# Patient Record
Sex: Male | Born: 1945 | Race: White | Hispanic: No | Marital: Married | Smoking: Former smoker
Health system: Southern US, Community
[De-identification: ages and names within clinical notes are randomized; demographics above are authoritative.]

## PROBLEM LIST (undated history)

## (undated) DIAGNOSIS — N250 Renal osteodystrophy: Secondary | ICD-10-CM

## (undated) DIAGNOSIS — D509 Iron deficiency anemia, unspecified: Secondary | ICD-10-CM

## (undated) DIAGNOSIS — N189 Chronic kidney disease, unspecified: Secondary | ICD-10-CM

## (undated) DIAGNOSIS — E8770 Fluid overload, unspecified: Secondary | ICD-10-CM

## (undated) DIAGNOSIS — I509 Heart failure, unspecified: Secondary | ICD-10-CM

## (undated) DIAGNOSIS — R52 Pain, unspecified: Secondary | ICD-10-CM

## (undated) DIAGNOSIS — D689 Coagulation defect, unspecified: Secondary | ICD-10-CM

## (undated) DIAGNOSIS — N185 Chronic kidney disease, stage 5: Secondary | ICD-10-CM

## (undated) DIAGNOSIS — M1990 Unspecified osteoarthritis, unspecified site: Secondary | ICD-10-CM

## (undated) DIAGNOSIS — E46 Unspecified protein-calorie malnutrition: Secondary | ICD-10-CM

## (undated) DIAGNOSIS — R011 Cardiac murmur, unspecified: Secondary | ICD-10-CM

## (undated) DIAGNOSIS — K219 Gastro-esophageal reflux disease without esophagitis: Secondary | ICD-10-CM

## (undated) DIAGNOSIS — D631 Anemia in chronic kidney disease: Secondary | ICD-10-CM

## (undated) DIAGNOSIS — E119 Type 2 diabetes mellitus without complications: Secondary | ICD-10-CM

## (undated) DIAGNOSIS — R197 Diarrhea, unspecified: Secondary | ICD-10-CM

## (undated) DIAGNOSIS — N2581 Secondary hyperparathyroidism of renal origin: Secondary | ICD-10-CM

## (undated) DIAGNOSIS — L299 Pruritus, unspecified: Secondary | ICD-10-CM

## (undated) DIAGNOSIS — I729 Aneurysm of unspecified site: Secondary | ICD-10-CM

## (undated) DIAGNOSIS — L97509 Non-pressure chronic ulcer of other part of unspecified foot with unspecified severity: Secondary | ICD-10-CM

## (undated) DIAGNOSIS — E11649 Type 2 diabetes mellitus with hypoglycemia without coma: Secondary | ICD-10-CM

## (undated) DIAGNOSIS — I2510 Atherosclerotic heart disease of native coronary artery without angina pectoris: Secondary | ICD-10-CM

## (undated) DIAGNOSIS — R0602 Shortness of breath: Secondary | ICD-10-CM

## (undated) HISTORY — DX: Chronic kidney disease, stage 5: N18.5

## (undated) HISTORY — DX: Shortness of breath: R06.02

## (undated) HISTORY — DX: Fluid overload, unspecified: E87.70

## (undated) HISTORY — DX: Coagulation defect, unspecified: D68.9

## (undated) HISTORY — DX: Type 2 diabetes mellitus with hypoglycemia without coma: E11.649

## (undated) HISTORY — DX: Non-pressure chronic ulcer of other part of unspecified foot with unspecified severity: L97.509

## (undated) HISTORY — DX: Anemia in chronic kidney disease: N18.9

## (undated) HISTORY — DX: Hypercalcemia: E83.52

## (undated) HISTORY — PX: COLONOSCOPY: SHX174

## (undated) HISTORY — DX: Heart failure, unspecified: I50.9

## (undated) HISTORY — PX: FOOT SURGERY: SHX648

## (undated) HISTORY — DX: Iron deficiency anemia, unspecified: D50.9

## (undated) HISTORY — PX: HAMMER TOE SURGERY: SHX385

## (undated) HISTORY — DX: Anemia in chronic kidney disease: D63.1

## (undated) HISTORY — DX: Secondary hyperparathyroidism of renal origin: N25.81

## (undated) HISTORY — DX: Pruritus, unspecified: L29.9

## (undated) HISTORY — DX: Pain, unspecified: R52

## (undated) HISTORY — DX: Diarrhea, unspecified: R19.7

## (undated) HISTORY — DX: Renal osteodystrophy: N25.0

## (undated) HISTORY — DX: Type 2 diabetes mellitus without complications: E11.9

## (undated) HISTORY — DX: Aneurysm of unspecified site: I72.9

## (undated) HISTORY — DX: Unspecified protein-calorie malnutrition: E46

## (undated) HISTORY — PX: OTHER SURGICAL HISTORY: SHX169

---

## 2006-08-26 HISTORY — PX: CORONARY ARTERY BYPASS GRAFT: SHX141

## 2011-07-29 DIAGNOSIS — I10 Essential (primary) hypertension: Secondary | ICD-10-CM | POA: Insufficient documentation

## 2011-07-29 DIAGNOSIS — R609 Edema, unspecified: Secondary | ICD-10-CM | POA: Insufficient documentation

## 2011-07-29 DIAGNOSIS — E785 Hyperlipidemia, unspecified: Secondary | ICD-10-CM | POA: Insufficient documentation

## 2012-02-12 DIAGNOSIS — T148XXA Other injury of unspecified body region, initial encounter: Secondary | ICD-10-CM | POA: Insufficient documentation

## 2012-02-12 DIAGNOSIS — W19XXXA Unspecified fall, initial encounter: Secondary | ICD-10-CM | POA: Insufficient documentation

## 2012-09-18 DIAGNOSIS — H6120 Impacted cerumen, unspecified ear: Secondary | ICD-10-CM | POA: Insufficient documentation

## 2013-04-26 DIAGNOSIS — L0390 Cellulitis, unspecified: Secondary | ICD-10-CM | POA: Insufficient documentation

## 2014-01-23 DIAGNOSIS — S82142A Displaced bicondylar fracture of left tibia, initial encounter for closed fracture: Secondary | ICD-10-CM | POA: Insufficient documentation

## 2014-01-31 DIAGNOSIS — S82892A Other fracture of left lower leg, initial encounter for closed fracture: Secondary | ICD-10-CM | POA: Insufficient documentation

## 2014-04-07 DIAGNOSIS — S82209A Unspecified fracture of shaft of unspecified tibia, initial encounter for closed fracture: Secondary | ICD-10-CM | POA: Insufficient documentation

## 2014-04-15 DIAGNOSIS — I358 Other nonrheumatic aortic valve disorders: Secondary | ICD-10-CM | POA: Insufficient documentation

## 2014-05-14 DIAGNOSIS — E1110 Type 2 diabetes mellitus with ketoacidosis without coma: Secondary | ICD-10-CM | POA: Insufficient documentation

## 2014-06-02 DIAGNOSIS — I5032 Chronic diastolic (congestive) heart failure: Secondary | ICD-10-CM | POA: Insufficient documentation

## 2014-08-26 HISTORY — PX: AORTIC VALVE REPLACEMENT: SHX41

## 2014-12-21 DIAGNOSIS — I2510 Atherosclerotic heart disease of native coronary artery without angina pectoris: Secondary | ICD-10-CM | POA: Insufficient documentation

## 2015-04-28 DIAGNOSIS — L02214 Cutaneous abscess of groin: Secondary | ICD-10-CM | POA: Insufficient documentation

## 2015-08-27 HISTORY — PX: CORONARY ANGIOPLASTY: SHX604

## 2017-07-30 DIAGNOSIS — N2581 Secondary hyperparathyroidism of renal origin: Secondary | ICD-10-CM | POA: Insufficient documentation

## 2017-07-30 DIAGNOSIS — Z992 Dependence on renal dialysis: Secondary | ICD-10-CM | POA: Insufficient documentation

## 2017-07-30 DIAGNOSIS — Z794 Long term (current) use of insulin: Secondary | ICD-10-CM | POA: Insufficient documentation

## 2017-07-30 DIAGNOSIS — N250 Renal osteodystrophy: Secondary | ICD-10-CM | POA: Insufficient documentation

## 2017-07-30 DIAGNOSIS — E46 Unspecified protein-calorie malnutrition: Secondary | ICD-10-CM | POA: Insufficient documentation

## 2017-07-30 DIAGNOSIS — D509 Iron deficiency anemia, unspecified: Secondary | ICD-10-CM | POA: Insufficient documentation

## 2017-08-01 DIAGNOSIS — R52 Pain, unspecified: Secondary | ICD-10-CM | POA: Insufficient documentation

## 2017-08-01 DIAGNOSIS — D689 Coagulation defect, unspecified: Secondary | ICD-10-CM | POA: Insufficient documentation

## 2017-08-01 DIAGNOSIS — R197 Diarrhea, unspecified: Secondary | ICD-10-CM | POA: Insufficient documentation

## 2017-08-01 DIAGNOSIS — L299 Pruritus, unspecified: Secondary | ICD-10-CM | POA: Insufficient documentation

## 2017-10-22 DIAGNOSIS — E8770 Fluid overload, unspecified: Secondary | ICD-10-CM | POA: Insufficient documentation

## 2018-04-01 ENCOUNTER — Other Ambulatory Visit: Payer: Self-pay

## 2018-04-01 DIAGNOSIS — L97509 Non-pressure chronic ulcer of other part of unspecified foot with unspecified severity: Secondary | ICD-10-CM

## 2018-04-06 ENCOUNTER — Encounter: Payer: Self-pay | Admitting: Vascular Surgery

## 2018-04-07 ENCOUNTER — Ambulatory Visit (INDEPENDENT_AMBULATORY_CARE_PROVIDER_SITE_OTHER): Payer: Medicare Other

## 2018-04-07 ENCOUNTER — Ambulatory Visit (INDEPENDENT_AMBULATORY_CARE_PROVIDER_SITE_OTHER): Payer: Medicare Other | Admitting: Podiatry

## 2018-04-07 DIAGNOSIS — I739 Peripheral vascular disease, unspecified: Secondary | ICD-10-CM | POA: Diagnosis not present

## 2018-04-07 DIAGNOSIS — E119 Type 2 diabetes mellitus without complications: Secondary | ICD-10-CM | POA: Diagnosis not present

## 2018-04-07 DIAGNOSIS — E1151 Type 2 diabetes mellitus with diabetic peripheral angiopathy without gangrene: Secondary | ICD-10-CM | POA: Diagnosis not present

## 2018-04-07 DIAGNOSIS — M21611 Bunion of right foot: Secondary | ICD-10-CM

## 2018-04-07 DIAGNOSIS — L97511 Non-pressure chronic ulcer of other part of right foot limited to breakdown of skin: Secondary | ICD-10-CM

## 2018-04-07 DIAGNOSIS — M205X1 Other deformities of toe(s) (acquired), right foot: Secondary | ICD-10-CM

## 2018-04-07 DIAGNOSIS — M2011 Hallux valgus (acquired), right foot: Secondary | ICD-10-CM

## 2018-04-07 NOTE — Progress Notes (Signed)
Subjective:  Patient ID: John Ortiz, male    DOB: 02/22/1946,  MRN: [id number]   72 y.o. male presents for wound care. Reports wound to the bottom of the right foot below the great toe.  Started years ago.  Has had issues ever since.  Reports 7 out of 10 throbbing pain associated with it.  Has used Eucerin cream and Band-Aids.  Review of Systems: Negative except as noted in the HPI. Denies N/V/F/Ch.  Past Medical History:  Diagnosis Date  . Anemia in CKD (chronic kidney disease)   . Aneurysm (HCC)    Left arm  . CHF (congestive heart failure) ([employer])   . Chronic kidney disease, stage V ([place])   . Coagulation defect ([place])   . Diabetes mellitus type 2, uncomplicated ([place])   . Diarrhea   . Fluid overload   . Hypercalcemia   . Iron deficiency anemia, unspecified   . Non-healing ulcer of foot ([place])    Right foot  . Pain   . Pruritus   . Renal osteodystrophy   . Secondary hyperparathyroidism of renal origin ([place])   . Shortness of breath   . Type 2 diabetes mellitus with hypoglycemia without coma ([place])   . Unspecified protein-calorie malnutrition ([place])     Current Outpatient Medications:  .  aspirin EC 81 MG tablet, Take 81 mg by mouth daily., Disp: , Rfl:  .  calcium acetate (PHOSLO) 667 MG capsule, Take 2,001 mg by mouth 3 (three) times daily with meals., Disp: , Rfl:  .  clopidogrel (PLAVIX) 75 MG tablet, Take 75 mg by mouth daily., Disp: , Rfl:  .  Cyanocobalamin (B-12) 500 MCG TABS, Take 500 mcg by mouth 3 (three) times daily. , Disp: , Rfl:  .  FLUoxetine (PROZAC) 20 MG capsule, Take 20 mg by mouth daily., Disp: , Rfl:  .  furosemide (LASIX) 40 MG tablet, Take 40 mg by mouth daily. , Disp: , Rfl:  .  insulin aspart (NOVOLOG FLEXPEN) 100 UNIT/ML FlexPen, Inject 7-15 Units into the skin 3 (three) times daily with meals. Per sliding scale, Disp: , Rfl:  .  insulin glargine (LANTUS) 100 UNIT/ML injection, Inject 4 Units into the skin 2 (two) times daily. , Disp: , Rfl:  .   simvastatin (ZOCOR) 80 MG tablet, Take 80 mg by mouth daily., Disp: , Rfl:   Social History   Tobacco Use  Smoking Status Former Smoker  Smokeless Tobacco Never Used    No Known Allergies Objective:  There were no vitals filed for this visit. There is no height or weight on file to calculate BMI. Constitutional Well developed. Well nourished.  Vascular Dorsalis pedis pulses 1+ bilaterally. Posterior tibial pulses non-palpable bilaterally. Capillary refill normal to all digits.  No cyanosis or clubbing noted. Pedal hair growth absent.  Neurologic Normal speech. Oriented to person, place, and time. Protective sensation absent  Dermatologic Wound Location: R 1st MPJ Wound Base: Granular/Healthy Peri-wound: Macerated, Calloused Exudate: Moderate amount Serosanguinous exudate Wound Measurements: -8/13: 4.5x3 post-debridement   Orthopedic: No pain to palpation either foot.   Radiographs: Taken and reviewed.  No underlying fracture dislocation.  No underlying osseous erosion Assessment:   1. Skin ulcer of right foot, limited to breakdown of skin ([place])   2. Diabetes mellitus type 2 with peripheral artery disease ([place])   3. Encounter for diabetic foot exam ([place])   4. PAD (peripheral artery disease) ([place])    Plan:  Patient was evaluated and treated and all questions answered.  Ulcer right first MPJ -Debridement as below. -Dressed with medihoney, DSD. -Advised to resume the use of his surgical shoe that he received. -We will order noninvasive vascular studies  Procedure: Excisional Debridement of Wound Rationale: Removal of non-viable soft tissue from the wound to promote healing.  Anesthesia: none Pre-Debridement Wound Measurements: 2 cm x 1.5 cm x 0.3 cm  Post-Debridement Wound Measurements: 4.5 cm x 3 cm x 0.3 cm  Type of Debridement: Sharp Excisional Tissue Removed: Non-viable soft tissue Depth of Debridement: subcutaneous tissue. Technique: Sharp excisional  debridement to bleeding, viable wound base.  Dressing: Dry, sterile, compression dressing. Disposition: Patient tolerated procedure well. Patient to return in 1 week for follow-up.  Return in about 1 week (around 04/14/2018) for Wound Care, Right.

## 2018-04-07 NOTE — Progress Notes (Signed)
d 

## 2018-04-08 ENCOUNTER — Other Ambulatory Visit: Payer: Self-pay | Admitting: Podiatry

## 2018-04-08 DIAGNOSIS — E119 Type 2 diabetes mellitus without complications: Secondary | ICD-10-CM

## 2018-04-08 DIAGNOSIS — E1151 Type 2 diabetes mellitus with diabetic peripheral angiopathy without gangrene: Secondary | ICD-10-CM

## 2018-04-08 DIAGNOSIS — L97511 Non-pressure chronic ulcer of other part of right foot limited to breakdown of skin: Secondary | ICD-10-CM

## 2018-04-08 DIAGNOSIS — I739 Peripheral vascular disease, unspecified: Secondary | ICD-10-CM

## 2018-04-13 ENCOUNTER — Ambulatory Visit (INDEPENDENT_AMBULATORY_CARE_PROVIDER_SITE_OTHER): Payer: Medicare Other | Admitting: Podiatry

## 2018-04-13 DIAGNOSIS — L97511 Non-pressure chronic ulcer of other part of right foot limited to breakdown of skin: Secondary | ICD-10-CM

## 2018-04-13 NOTE — Progress Notes (Signed)
  Subjective:  Patient ID: John Ortiz, male    DOB: Jul 22, 1946,  MRN: [id number]  No chief complaint on file.  72 y.o. male returns for wound care. Believes the wound to be looking worse. States the area bleeds and is peeling. Has been using Abx cream and band-aid. Brings with him his offloading shoe but doesn't wear it all the time. Denies N/V/F/Ch.  Objective:  There were no vitals filed for this visit. General AA&O x3. Normal mood and affect.  Vascular Foot warm to touch.  Neurologic Sensation grossly diminished.  Dermatologic (Wound) Wound Location: R 1st MPJ Wound Measurement: 2x2 centrally, 4.5x3  Wound Base: Granular/Healthy Peri-wound: Calloused Exudate: None: wound tissue dry    Wound progress: No Change since last check.  Orthopedic: No pain to palpation either foot.   Assessment & Plan:  Patient was evaluated and treated and all questions answered.  Ulcer R 1st MPJ -Debridement as below. -Dressed with silvadene, DSD. -Continue offloading surgical shoe.  Procedure: Excisional Debridement of Wound Rationale: Removal of non-viable soft tissue from the wound to promote healing.  Anesthesia: none Pre-Debridement Wound Measurements: 2 cm x 2 cm x 0.3 cm  Post-Debridement Wound Measurements: 2 cm x 2 cm x 0.3 cm  Type of Debridement: Excisional Tissue Removed: Non-viable soft tissue Depth of Debridement: subq Instrumentation: 3-0 mm dermal curette Technique: Sharp excisional debridement to bleeding, viable wound base.  Dressing: Dry, sterile, compression dressing. Disposition: Patient tolerated procedure well. Patient to return in 1 week for follow-up.  Return in about 2 weeks (around 04/27/2018) for Wound Care R.

## 2018-04-14 ENCOUNTER — Encounter: Payer: Self-pay | Admitting: *Deleted

## 2018-04-14 ENCOUNTER — Ambulatory Visit (INDEPENDENT_AMBULATORY_CARE_PROVIDER_SITE_OTHER): Payer: Medicare Other | Admitting: Vascular Surgery

## 2018-04-14 ENCOUNTER — Ambulatory Visit (HOSPITAL_COMMUNITY)
Admission: RE | Admit: 2018-04-14 | Discharge: 2018-04-14 | Disposition: A | Discharge status: Home / Self Care | Payer: Medicare Other | Source: Ambulatory Visit | Attending: Vascular Surgery | Admitting: Vascular Surgery

## 2018-04-14 ENCOUNTER — Other Ambulatory Visit: Payer: Self-pay

## 2018-04-14 ENCOUNTER — Other Ambulatory Visit: Payer: Self-pay | Admitting: *Deleted

## 2018-04-14 ENCOUNTER — Encounter: Payer: Self-pay | Admitting: Vascular Surgery

## 2018-04-14 VITALS — BP 123/71 | HR 93 | Temp 98.8°F | Resp 20 | Ht 66.0 in | Wt 174.6 lb

## 2018-04-14 DIAGNOSIS — I739 Peripheral vascular disease, unspecified: Secondary | ICD-10-CM | POA: Diagnosis not present

## 2018-04-14 DIAGNOSIS — I10 Essential (primary) hypertension: Secondary | ICD-10-CM | POA: Insufficient documentation

## 2018-04-14 DIAGNOSIS — L97509 Non-pressure chronic ulcer of other part of unspecified foot with unspecified severity: Secondary | ICD-10-CM | POA: Diagnosis not present

## 2018-04-14 DIAGNOSIS — Z992 Dependence on renal dialysis: Secondary | ICD-10-CM

## 2018-04-14 DIAGNOSIS — I998 Other disorder of circulatory system: Secondary | ICD-10-CM | POA: Diagnosis not present

## 2018-04-14 DIAGNOSIS — L97511 Non-pressure chronic ulcer of other part of right foot limited to breakdown of skin: Secondary | ICD-10-CM | POA: Diagnosis not present

## 2018-04-14 DIAGNOSIS — L97519 Non-pressure chronic ulcer of other part of right foot with unspecified severity: Secondary | ICD-10-CM | POA: Diagnosis present

## 2018-04-14 DIAGNOSIS — N186 End stage renal disease: Secondary | ICD-10-CM | POA: Diagnosis not present

## 2018-04-14 DIAGNOSIS — I70229 Atherosclerosis of native arteries of extremities with rest pain, unspecified extremity: Secondary | ICD-10-CM

## 2018-04-14 NOTE — Progress Notes (Signed)
Patient name: John Ortiz MRN: [id number] DOB: [birth year] Sex: male  REASON FOR CONSULT: Left upper extremity aneurysmal AV fistula and right lower extremity diabetic ulcer  HPI: John Ortiz is a 72 y.o. 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  Regarding the aneurysm at his fistula, the patient states the fistula was initially placed in [facility] about 10 to 15 years ago and he states they transposed a vein in his upper arm.  He is still using the fistula at this time.  He states he has had this aneurysm for over a year and it has remained unchanged over that timeline.  He said no bleeding issues or wound issues over the fistula itself.  No pain.  No difficulty with cannulation.  They are still using the fistula at this time without any issues. Regarding his right foot wound he states that the wound has been present for a number of years.  He denies any previous lower extremity revascularization procedures including no previous arteriogram or other surgery.  He denies any severe rest pain in the foot other than that the wound will not heal.  He has undergone left great toe amp that has healed years ago. Otherwise he denies any current tobacco abuse.  He admits that his diabetes has been poorly controlled.  He is still living independently at his home in [place].  Past Medical History:  Diagnosis Date  . Anemia in CKD (chronic kidney disease)   . Aneurysm (HCC)    Left arm  . CHF (congestive heart failure) ([place])   . Chronic kidney disease, stage V ([place])   . Coagulation defect ([place])   . Diabetes mellitus type 2, uncomplicated ([place])   . Diarrhea   . Fluid overload   . Hypercalcemia   . Iron deficiency anemia, unspecified   . Non-healing ulcer of foot ([place])    Right foot   . Pain   . Pruritus   . Renal osteodystrophy   . Secondary hyperparathyroidism of renal origin ([place])   . Shortness of breath   . Type 2 diabetes mellitus with hypoglycemia without coma ([place])   . Unspecified protein-calorie malnutrition ([place])     Past Surgical History:  Procedure Laterality Date  . av fistula transposed/ Above Elbow      History reviewed. No pertinent family history.  SOCIAL HISTORY: Social History   Socioeconomic History  . Marital status: Married    Spouse name: Not on file  . Number of children: Not on file  . Years of education: Not on file  . Highest education level: Not on file  Occupational History  . Not on file  Social Needs  . Financial resource strain: Not on file  . Food insecurity:    Worry: Not on file    Inability: Not on file  . Transportation needs:    Medical: Not on file    Non-medical: Not on file  Tobacco Use  . Smoking status: Former Research scientist (life sciences)  . Smokeless tobacco: Never Used  Substance and Sexual Activity  . Alcohol use: Not on file  . Drug use: Not on file  . Sexual activity: Not on file  Lifestyle  . Physical activity:    Days per week: Not on file    Minutes per session: Not on  file  . Stress: Not on file  Relationships  . Social connections:    Talks on phone: Not on file    Gets together: Not on file    Attends religious service: Not on file    Active member of club or organization: Not on file    Attends meetings of clubs or organizations: Not on file    Relationship status: Not on file  . Intimate partner violence:    Fear of current or ex partner: Not on file    Emotionally abused: Not on file    Physically abused: Not on file    Forced sexual activity: Not on file  Other Topics Concern  . Not on file  Social History Narrative  . Not on file    No Known Allergies  Current Outpatient Medications  Medication Sig Dispense Refill  . aspirin EC 81 MG tablet Take 81 mg by mouth daily.    . calcium acetate,  Phos Binder, (PHOSLYRA) 667 MG/5ML SOLN Take by mouth 3 (three) times daily with meals.    . clopidogrel (PLAVIX) 75 MG tablet Take 75 mg by mouth daily.    . Cyanocobalamin (B-12) 2500 MCG TABS Take by mouth.    Marland Kitchen FLUoxetine (PROZAC) 20 MG capsule Take 20 mg by mouth daily.    . furosemide (LASIX) 40 MG tablet Take 40 mg by mouth.    . Insulin Aspart (NOVOLOG FLEXPEN [place]) Inject 7 Units into the skin.    Marland Kitchen insulin glargine (LANTUS) 100 UNIT/ML injection Inject into the skin daily.    . simvastatin (ZOCOR) 80 MG tablet Take 80 mg by mouth daily.     No current facility-administered medications for this visit.     REVIEW OF SYSTEMS:  [X]  denotes positive finding, [ ]  denotes negative finding Cardiac  Comments:  Chest pain or chest pressure:    Shortness of breath upon exertion:    Short of breath when lying flat:    Irregular heart rhythm:        Vascular    Pain in calf, thigh, or hip brought on by ambulation: x   Pain in feet at night that wakes you up from your sleep:     Blood clot in your veins:    Leg swelling:     Wound on right foot x   Pulmonary    Oxygen at home:    Productive cough:     Wheezing:         Neurologic    Sudden weakness in arms or legs:     Sudden numbness in arms or legs:     Sudden onset of difficulty speaking or slurred speech:    Temporary loss of vision in one eye:     Problems with dizziness:         Gastrointestinal    Blood in stool:     Vomited blood:         Genitourinary    Burning when urinating:     Blood in urine:        Psychiatric    Major depression:         Hematologic    Bleeding problems:    Problems with blood clotting too easily:        Skin    Rashes or ulcers: x       Constitutional    Fever or chills:      PHYSICAL EXAM: Vitals:   04/14/18 1109  BP: 123/71  Pulse: 93  Resp: 20  Temp: 98.8 F (37.1 C)  TempSrc: Oral  SpO2: 99%  Weight: 79.2 kg  Height: 5\' 6"  (1.676 m)    GENERAL: The patient is a  well-nourished male, in no acute distress. The vital signs are documented above. CARDIAC: There is a regular rate and rhythm.  VASCULAR:  2+ palpable radial pulse to the bilateral upper extremities. 2+ palpable brachial pulse of the bilateral upper extremities. Left upper arm vein base fistula with tandem aneurysms.  The more distal aneurysm is probably 4 cm in diameter.  The associated aneurysm more proximal is much smaller.  Overlying skin appears viable.  He has a great thrill in the fistula. 2+ palpable femoral pulses in the bilateral groins. Old scar in the right groin that he states is from a hernia repair and not a vascular based procedure. No palpable popliteal or pedal pulses in either foot. He does have DP and PT signals in both feet. PULMONARY: There is good air exchange bilaterally without wheezing or rales. ABDOMEN: Soft and non-tender with normal pitched bowel sounds.  MUSCULOSKELETAL: There are no major deformities or cyanosis. NEUROLOGIC: No focal weakness or paresthesias are detected. SKIN: There are no ulcers or rashes noted. PSYCHIATRIC: The patient has a normal affect.  DATA:   I have intimately reviewed his noninvasive imaging and he has noncompressible ABIs of the bilateral lower extremities.  His right great toe pressure is 75.  He has biphasic waveforms at the right ankle versus triphasic waveforms at the left ankle.  Assessment/Plan:  72 year old male with history of end-stage renal disease as well as poorly controlled diabetes that presents for evaluation of an aneurysmal left upper extremity fistula as well as a non-healing diabetic ulcer on the bottom of his right foot.  I discussed with the patient that I think his foot ulcer is the most pressing issue and he has critical limb ischemia.  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  They have been able to access his fistula above the aneurysm without any  issue.  He said no bleeding issues or issues with skin integrity over the aneurysm itself.  Certainly would be happy to revise this in the future but think we can wait given no immediate issues.  I told him that whenever we do revise this it would likely require a temporary dialysis catheter.  Regarding his right lower extremity wound, he states it has been present for years and has been nonhealing.  He has poorly controlled diabetes and has noncompressible ABIs.  He has nonpalpable pedal pulses on exam but does have some biphasic signals at the ankle.  I recommended a right lower extremity arteriogram to evaluate his runoff in the right leg with possible intervention.  His toe pressure is 75 in the right foot which is marginal but given the chronicity of his wound I think it is time to go ahead with an invasive procedure.  Will arrange arteriogram at next available Thursday on a non-dialysis day.   Marty Heck, MD [facility] Office: [phone] Pager: [phone]

## 2018-04-21 ENCOUNTER — Encounter: Payer: Self-pay | Admitting: Cardiology

## 2018-04-21 ENCOUNTER — Ambulatory Visit (INDEPENDENT_AMBULATORY_CARE_PROVIDER_SITE_OTHER): Payer: No Typology Code available for payment source | Admitting: Cardiology

## 2018-04-21 VITALS — BP 136/86 | HR 98 | Ht 67.0 in | Wt 177.2 lb

## 2018-04-21 DIAGNOSIS — I350 Nonrheumatic aortic (valve) stenosis: Secondary | ICD-10-CM

## 2018-04-21 DIAGNOSIS — I4892 Unspecified atrial flutter: Secondary | ICD-10-CM

## 2018-04-21 DIAGNOSIS — I4891 Unspecified atrial fibrillation: Secondary | ICD-10-CM | POA: Diagnosis not present

## 2018-04-21 DIAGNOSIS — I25810 Atherosclerosis of coronary artery bypass graft(s) without angina pectoris: Secondary | ICD-10-CM | POA: Diagnosis not present

## 2018-04-21 DIAGNOSIS — Z952 Presence of prosthetic heart valve: Secondary | ICD-10-CM

## 2018-04-21 DIAGNOSIS — I739 Peripheral vascular disease, unspecified: Secondary | ICD-10-CM

## 2018-04-21 DIAGNOSIS — N186 End stage renal disease: Secondary | ICD-10-CM

## 2018-04-21 DIAGNOSIS — Z992 Dependence on renal dialysis: Secondary | ICD-10-CM

## 2018-04-21 DIAGNOSIS — Z951 Presence of aortocoronary bypass graft: Secondary | ICD-10-CM | POA: Diagnosis not present

## 2018-04-21 NOTE — Patient Instructions (Signed)
Medication Instructions:  Your physician recommends that you continue on your current medications as directed. Please refer to the Current Medication list given to you today.  Follow-Up: 3 months with Dr. Harrell Gave  Any Other Special Instructions Will Be Listed Below (If Applicable).     If you need a refill on your cardiac medications before your next appointment, please call your pharmacy.

## 2018-04-21 NOTE — Progress Notes (Signed)
Cardiology Office Note:    Date:  04/21/2018   ID:  John Ortiz, DOB December 15, 1945, MRN [id number]  PCP:  [facility]  Cardiologist:  Buford Dresser, MD PhD  Referring MD: [place]   Chief Complaint  Patient presents with  . New Patient (Initial Visit)    History of Present Illness:    John Ortiz is a 72 y.o. male with a hx of ESRD on hemodialysis, type II diabetes, CAD s/p CABG, moderate aortic stenosis who is seen as a new consult at the request of the [facility] for evaluation of moderate aortic stenosis, per records received from [facility].  Patient is a very articulate, verbose, pleasant gentleman here to establish care. He does not have any specific concerns today but wants to make sure that there is nothing that needs to be addressed from a cardiac perspective. His only concern is that his blood pressure at the end of dialysis is sometimes 24M systolic, but he does not feel poorly with this. He is unsure of what his blood pressure is when he starts dialysis. No chest pain or shortness of breath, no PND or orthopnea, no syncope. The documentation I have from his dialysis center is that on a session on 03/23/18 his pre-HD blood pressure was 132/77, his post-HD pressure was 109/64, with 4 kg fluid removed. Post HD weight was 77.1 kg.  He lived in [place] for many years and recently moved to [place], [place]. He has a history of 3V CABG done in 2008 (he thought it was 37) at [facility] with Dr. Geraldo Docker (he calls him Dr. Luetta Nutting). Requested records on Care Everywhere. Also noted to have prior bicuspid aortic valve, now s/p AVR 08/2014 with redo sternotomy at [facility] (post op course complicated by groin abscess).   Per outside records, last cath 01/2016 after abnormal stress test. He received a DES to OM1. His LIMA was patent, but the two prior SVG conduits were documented as previously known to be occluded. He was  seen at [facility] within the last two months for evaluation for kidney transplant and had an echo (noted below) done at that time. He was deemed to not be a candidate for kidney transplant by [facility].  He was seen to be in atrial fibrillation on ECG today. He does not remember ever being told that he has atrial fibrillation or flutter. On review of his records, it was noted that during a hospitalization on 02/08/16 he had atrial fibrillation and was cardioverted. There are also notes that he had similar postoperative atrial fibrillation in 09/2014 after his AVR, also requiring cardioversion. From what I can gather through the notes, he was not on anticoagulation previously.  This upcoming Thursday, having a peripheral angiogram of his right leg in [place]. Has chronic wound on the foot and poor pulses bilaterally.  Past Medical History:  Diagnosis Date  . Anemia in CKD (chronic kidney disease)   . Aneurysm (HCC)    Left arm  . CHF (congestive heart failure) ([place])   . Chronic kidney disease, stage V ([place])   . Coagulation defect ([place])   . Diabetes mellitus type 2, uncomplicated ([place])   . Diarrhea   . Fluid overload   . Hypercalcemia   . Iron deficiency anemia, unspecified   . Non-healing ulcer of foot ([place])    Right foot  . Pain   . Pruritus   . Renal osteodystrophy   .  Secondary hyperparathyroidism of renal origin ([place])   . Shortness of breath   . Type 2 diabetes mellitus with hypoglycemia without coma ([place])   . Unspecified protein-calorie malnutrition ([place])     Past Surgical History:  Procedure Laterality Date  . av fistula transposed/ Above [place]bow      Current Medications: Current Outpatient Medications on File Prior to Visit  Medication Sig  . aspirin EC 81 MG tablet Take 81 mg by mouth daily.  . calcium acetate, Phos Binder, (PHOSLYRA) 667 MG/5ML SOLN Take by mouth 3 (three) times daily with meals.  . clopidogrel (PLAVIX) 75 MG tablet Take 75 mg by mouth daily.    . Cyanocobalamin (B-12) 2500 MCG TABS Take by mouth.  Marland Kitchen FLUoxetine (PROZAC) 20 MG capsule Take 20 mg by mouth daily.  . furosemide (LASIX) 40 MG tablet Take 40 mg by mouth.  . Insulin Aspart (NOVOLOG FLEXPEN [place]) Inject 7 Units into the skin.  Marland Kitchen insulin glargine (LANTUS) 100 UNIT/ML injection Inject into the skin daily.  . simvastatin (ZOCOR) 80 MG tablet Take 80 mg by mouth daily.   No current facility-administered medications on file prior to visit.      Allergies:   Patient has no known allergies.   Social History   Socioeconomic History  . Marital status: Married    Spouse name: Not on file  . Number of children: Not on file  . Years of education: Not on file  . Highest education level: Not on file  Occupational History  . Not on file  Social Needs  . Financial resource strain: Not on file  . Food insecurity:    Worry: Not on file    Inability: Not on file  . Transportation needs:    Medical: Not on file    Non-medical: Not on file  Tobacco Use  . Smoking status: Former Research scientist (life sciences)  . Smokeless tobacco: Never Used  Substance and Sexual Activity  . Alcohol use: Not on file  . Drug use: Not on file  . Sexual activity: Not on file  Lifestyle  . Physical activity:    Days per week: Not on file    Minutes per session: Not on file  . Stress: Not on file  Relationships  . Social connections:    Talks on phone: Not on file    Gets together: Not on file    Attends religious service: Not on file    Active member of club or organization: Not on file    Attends meetings of clubs or organizations: Not on file    Relationship status: Not on file  Other Topics Concern  . Not on file  Social History Narrative  . Not on file   Married, former smoker (1 ppd for 20 years, quit in 1990). Drinks 2 beers/year on the super bowl.  Family History: The patient's FH is notable for CAD, asthma in his mother, diabetes in his father, COPD in his brother  ROS:   Please see the history of  present illness.  Additional pertinent ROS: Review of Systems  Constitutional: Negative for chills, fever and weight loss.  HENT: Negative for ear pain and hearing loss.   Eyes: Negative for blurred vision and pain.  Respiratory: Negative for hemoptysis and shortness of breath.   Cardiovascular: Positive for claudication. Negative for chest pain, palpitations, orthopnea, leg swelling and PND.  Gastrointestinal: Negative for abdominal pain, blood in stool and melena.  Genitourinary: Negative for flank pain and hematuria.  Musculoskeletal: Positive  for joint pain. Negative for falls.  Skin: Negative for rash.  Neurological: Negative for focal weakness and loss of consciousness.  Endo/Heme/Allergies: Bruises/bleeds easily.   EKGs/Labs/Other Studies Reviewed:    The following studies were reviewed today: Echo from 03/03/18 [facility]/CareEverywhere) SUMMARY The left ventricular size is normal.  Left ventricular systolic function is low normal. LV ejection fraction = 50-55%. The left ventricular wall motion is normal.  Left ventricular filling pattern is indeterminate. The right ventricle is normal in size and function. The left atrium is mildly dilated. Diffuse calcification of the aortic valve. There is moderate aortic stenosis. Aortic valve mean pressure gradient is 28 mmHg. The peak aortic valve velocity 354 cm/s. There is mild mitral regurgitation. IVC size was mildly dilated. There is no pericardial effusion. There is no comparison study available. R/LHC for abnormal stress test on 01/31/2016 [facility] 02/08/16 (Dr. Percell Miller) FINDINGS: HEMODYNAMICS: Heart rate 63. Aortic pressure 165/64. CORONARY ANATOMY: The left coronary: Left main trunk; angiographically normal.  Left anterior descending coronary artery: This is a large-caliber vessel that wraps around the apex. It is subtotally occluded after the takeoff of a large septal perforator. The LAD is reconstituted distally by flow  from a patent left internal mammary artery graft.  Left circumflex: This is a large-caliber dominant system that gives rise to a large lateral branch and a large posterolateral branch. There is an eccentric plaque of about 50% in the AV circumflex just prior to the takeoff of the lateral branch. In the proximal portion of the lateral branch, there is some tubular narrowing in the 50-60% range.  Right coronary artery: This is a small-caliber nondominant vessel that contains some tubular narrowing of 50% in its proximal portion.  Saphenous vein grafts x2 were not selectively injected and were known to be occluded.  Left internal mammary artery graft to LAD could not be selectively engaged; however, nonselectively appears widely patent with distal filling of the LAD. DIAGNOSES: 1.Significant left anterior descending coronary artery stenosis, with widely patent left internal mammary artery graft to the left anterior descending.  2.Borderline obstructive disease in the dominant left circumflex distribution, with angiographic obstruction of 50% and 50-60% respectively. COMMENTS: Angiographically, the patient appears to be a candidate for fractional flow reserve assessment of the left circumflex borderline obstructive lesions. If these are not flow-limiting, consideration will be given to right heart catheterization to assess volume status. The patient is dialysis dependent.  FFR and PCI/DES (Promus) to OM1 02/08/16 (Dr. Jenean Lindau) Fractional flow reserve data:  Fractional flow reserve measurements were obtained using IV adenosine per protocol. Coronary vesselFFR First marginal of left circumflex0.78 Equipment Used: Guide: 28F EBU 3.75 Steering Wire: Doppler flow wire Balloon(s): 3.5/15 [place] Euphora Stent(s): 4.0/20 Promus Premier stent Other equipment: Guideliner Results: Lesion locationPre-PCI %  stenosisPost-PCI % stenosis First marginal branch75%0% Conclusions: Successful drug-eluting stent placement to the first marginal branch artery of the left circumflex artery.   EKG:  EKG is ordered today.  The ekg ordered today demonstrates atrial fibrillation (with also section of possible atrial flutter given regularity at the end of the strip) with ventricular rate of 98 bpm. IVCD in left bundle pattern.  Recent Labs: No results found for requested labs within last 8760 hours.  Recent Lipid Panel No results found for: CHOL, TRIG, HDL, CHOLHDL, VLDL, LDLCALC, LDLDIRECT  Physical Exam:    VS:  BP 136/86   Pulse 98   Ht 5\' 7"  (1.702 m)   Wt 177 lb 3.2 oz (80.4 kg)  BMI 27.75 kg/m     Wt Readings from Last 3 Encounters:  04/21/18 177 lb 3.2 oz (80.4 kg)  04/14/18 174 lb 9.6 oz (79.2 kg)     GEN: Thin but well appearing gentleman, very pleasant and verbose, fair historian with details HEENT: Normal NECK: No JVD; No thyromegaly LYMPHATICS: No lymphadenopathy CARDIAC: irregularly irregular rhythm, normal S1 and S2, 3/6 harsh SEM, early peaking, at RUSB. no rubs, gallops. Radial pulses 2+ bilaterally. Lower extremities warm but unable to palpate distal lower extremity pulses. RESPIRATORY:  Clear to auscultation without rales, wheezing or rhonchi  ABDOMEN: Soft, non-tender, non-distended MUSCULOSKELETAL:  Trace bilateral LE edema. Left arm fistula with bruit and thrill SKIN: Warm and dry NEUROLOGIC:  Ambulates independently with walking stick.  PSYCHIATRIC:  Not pressured in speech but mildly tangential, repeated a story twice.  ASSESSMENT:    1. Atrial fibrillation and flutter ([place])   2. Coronary artery disease involving coronary bypass graft of native heart without angina pectoris   3. Aortic stenosis, moderate   4. Hx of CABG   5. S/P AVR (aortic valve replacement)   6. ESRD (end stage renal  disease) on dialysis ([place])   7. PAD (peripheral artery disease) ([place])    PLAN:    1. Atrial fibrillation CHA2DS2/VAS Stroke Risk Points  =  4  >= 2 Points: High Risk  1 - 1.99 Points: Medium Risk  0 Points: Low Risk    Points Metrics  0 Has Congestive Heart Failure:  No   1 Has Vascular Disease:  Yes   1 Has Hypertension:  Yes    1 Age:  44   1 Has Diabetes:  Yes   0 Had Stroke:  No  Had TIA:  No  Had thromboembolism:  No   0 Male:  No    He is high risk for a stroke. While he stated that he had never been told he has afib before, there were at least two instances in the past (peri-procedurally) that noted afib requiring cardioversion. We discussed options, specifically that warfarin is the most studied anticoagulant in ESRD. He does not want to risk bleeding on dialysis. He is establishing with a PCP at the [place] but does not currently have anyone managing his non-dialysis issues. I noted that we could have INRs drawn at dialysis and monitored either through the [place] or another way, but he is not interested. Will need to be readdressed. He is on no rate control agents and is currently rate controlled autonomously. He notes hypotension at dialysis, so he may not have room for metoprolol or another agent. However, if he develops fast afib we will need to find a way to control. Concern for amiodarone is lack of anticoagulation and risk of thromboembolic stroke with chemical cardioversion.    2. CAD s/p CABG (with two SVG vein grafts known down, stent to OM1): no active chest pain, no chest pain on dialysis. On aspirin and clopidogrel. On simvastatin--would consider changing to atorvastatin, though he thinks there is a reason they kept him on simvastatin in the past. He is secondary prevention at this time.  3. Aortic stenosis s/p AVR: moderate AS on recent echo from [facility].  4. PAD: nonpalpable pulses in bilateral LE. He reports that he is already being followed and planned for an angiogram  and procedure next week in [place]. Offered our assistance for evaluation and management if needed.  5. ESRD on dialysis: [facility]  Plan for follow up:  3 months, will continue to address his cardiac conditions and revisit statin/anticoagulation changes  TIME SPENT WITH PATIENT: >80 minutes of direct patient care. More than 50% of that time was spent on coordination of care and counseling regarding prior CAD history, PAD, atrial fibrillation, and management of his risk of stroke or other complications.  Buford Dresser, MD, PhD [place]  [facility]   Medication Adjustments/Labs and Tests Ordered: Current medicines are reviewed at length with the patient today.  Concerns regarding medicines are outlined above.  Orders Placed This Encounter  Procedures  . EKG 12-Lead   No orders of the defined types were placed in this encounter.   Patient Instructions  Medication Instructions:  Your physician recommends that you continue on your current medications as directed. Please refer to the Current Medication list given to you today.  Follow-Up: 3 months with Dr. Harrell Gave  Any Other Special Instructions Will Be Listed Below (If Applicable).     If you need a refill on your cardiac medications before your next appointment, please call your pharmacy.      Signed, Buford Dresser, MD PhD 04/21/2018 3:38 PM    [place]

## 2018-04-22 ENCOUNTER — Encounter: Payer: Self-pay | Admitting: Cardiology

## 2018-04-22 DIAGNOSIS — I4891 Unspecified atrial fibrillation: Secondary | ICD-10-CM | POA: Insufficient documentation

## 2018-04-22 DIAGNOSIS — I25810 Atherosclerosis of coronary artery bypass graft(s) without angina pectoris: Secondary | ICD-10-CM | POA: Insufficient documentation

## 2018-04-22 DIAGNOSIS — Z992 Dependence on renal dialysis: Secondary | ICD-10-CM

## 2018-04-22 DIAGNOSIS — N186 End stage renal disease: Secondary | ICD-10-CM | POA: Insufficient documentation

## 2018-04-22 DIAGNOSIS — I4892 Unspecified atrial flutter: Principal | ICD-10-CM

## 2018-04-22 DIAGNOSIS — Z952 Presence of prosthetic heart valve: Secondary | ICD-10-CM | POA: Insufficient documentation

## 2018-04-22 DIAGNOSIS — I350 Nonrheumatic aortic (valve) stenosis: Secondary | ICD-10-CM | POA: Insufficient documentation

## 2018-04-22 DIAGNOSIS — Z951 Presence of aortocoronary bypass graft: Secondary | ICD-10-CM | POA: Insufficient documentation

## 2018-04-28 ENCOUNTER — Ambulatory Visit (INDEPENDENT_AMBULATORY_CARE_PROVIDER_SITE_OTHER): Payer: Medicare Other | Admitting: Podiatry

## 2018-04-28 ENCOUNTER — Encounter: Payer: Self-pay | Admitting: Podiatry

## 2018-04-28 DIAGNOSIS — L97511 Non-pressure chronic ulcer of other part of right foot limited to breakdown of skin: Secondary | ICD-10-CM

## 2018-04-28 DIAGNOSIS — I739 Peripheral vascular disease, unspecified: Secondary | ICD-10-CM

## 2018-04-28 NOTE — Progress Notes (Signed)
Subjective:  Patient ID: John Ortiz, male    DOB: 1946/05/12,  MRN: [id number]  Chief Complaint  Patient presents with  . Foot Ulcer    right foot follow up; pt stated, "doing good, no new concerns"    72 y.o. male presents for wound care. States the area is doing well. Has appt this week for Angio.   Review of Systems: Negative except as noted in the HPI. Denies N/V/F/Ch.  Past Medical History:  Diagnosis Date  . Anemia in CKD (chronic kidney disease)   . Aneurysm (HCC)    Left arm  . CHF (congestive heart failure) ([place])   . Chronic kidney disease, stage V ([place])   . Coagulation defect ([place])   . Diabetes mellitus type 2, uncomplicated ([place])   . Diarrhea   . Fluid overload   . Hypercalcemia   . Iron deficiency anemia, unspecified   . Non-healing ulcer of foot ([place])    Right foot  . Pain   . Pruritus   . Renal osteodystrophy   . Secondary hyperparathyroidism of renal origin ([place])   . Shortness of breath   . Type 2 diabetes mellitus with hypoglycemia without coma ([place])   . Unspecified protein-calorie malnutrition ([place])     Current Outpatient Medications:  .  aspirin EC 81 MG tablet, Take 81 mg by mouth daily., Disp: , Rfl:  .  calcium acetate (PHOSLO) 667 MG capsule, Take 2,001 mg by mouth 3 (three) times daily with meals., Disp: , Rfl:  .  clopidogrel (PLAVIX) 75 MG tablet, Take 75 mg by mouth daily., Disp: , Rfl:  .  Cyanocobalamin (B-12) 500 MCG TABS, Take 500 mcg by mouth 3 (three) times daily. , Disp: , Rfl:  .  FLUoxetine (PROZAC) 20 MG capsule, Take 20 mg by mouth daily., Disp: , Rfl:  .  furosemide (LASIX) 40 MG tablet, Take 40 mg by mouth daily. , Disp: , Rfl:  .  insulin aspart (NOVOLOG FLEXPEN) 100 UNIT/ML FlexPen, Inject 7-15 Units into the skin 3 (three) times daily with meals. Per sliding scale, Disp: , Rfl:  .  insulin glargine (LANTUS) 100 UNIT/ML injection, Inject 4 Units into the skin 2 (two) times daily. , Disp: , Rfl:  .  simvastatin (ZOCOR)  80 MG tablet, Take 80 mg by mouth daily., Disp: , Rfl:   Social History   Tobacco Use  Smoking Status Former Smoker  Smokeless Tobacco Never Used    No Known Allergies Objective:  There were no vitals filed for this visit. There is no height or weight on file to calculate BMI. Constitutional Well developed. Well nourished.  Vascular Dorsalis pedis pulses palpable bilaterally. Posterior tibial pulses palpable bilaterally. Capillary refill normal to all digits.  No cyanosis or clubbing noted. Pedal hair growth normal.  Neurologic Normal speech. Oriented to person, place, and time. Protective sensation absent  Dermatologic Wound Location: R 1st MPJ Wound Base: Granular/Healthy Peri-wound: Calloused Exudate: Scant/small amount Serosanguinous exudate Wound Measurements: -04/13/18: 4.5x3 post-debridement -04/28/18: 2.5x2.5 post-debridement   Orthopedic: No pain to palpation either foot.   Radiographs: None today Assessment:   1. Skin ulcer of right foot, limited to breakdown of skin ([place])   2. PAD (peripheral artery disease) ([place])    Plan:  Patient was evaluated and treated and all questions answered.  Ulcer R 1st MPJ -Debridement as below. -Dressed with medihoney, DSD. -Continue off-loading with surgical shoe.  Procedure: Excisional Debridement of Wound Rationale: Removal of non-viable soft tissue from the wound  to promote healing.  Anesthesia: none Pre-Debridement Wound Measurements: 2 cm x 2 cm x 0.3 cm  Post-Debridement Wound Measurements: 2.5 cm x 2.5 cm x 0.3 cm  Type of Debridement: Sharp Excisional Tissue Removed: Non-viable soft tissue Depth of Debridement: subcutaneous tissue. Technique: Sharp excisional debridement to bleeding, viable wound base.  Dressing: Dry, sterile, compression dressing. Disposition: Patient tolerated procedure well. Patient to return in 1 week for follow-up.  PAD -Discussed benefits of angio and the need for good blood flow for  wound healing. -Educated on process of angioplasty and stenting -Will f/u results at next visit.  Return in about 2 weeks (around 05/12/2018) for Wound Care, Right.

## 2018-04-30 ENCOUNTER — Ambulatory Visit (HOSPITAL_COMMUNITY)
Admission: RE | Admit: 2018-04-30 | Discharge: 2018-04-30 | Disposition: A | Discharge status: Home / Self Care | Payer: Medicare Other | Source: Ambulatory Visit | Attending: Vascular Surgery | Admitting: Vascular Surgery

## 2018-04-30 ENCOUNTER — Encounter (HOSPITAL_COMMUNITY)
Admission: RE | Disposition: A | Discharge status: Home / Self Care | Payer: Self-pay | Source: Ambulatory Visit | Attending: Vascular Surgery

## 2018-04-30 DIAGNOSIS — N250 Renal osteodystrophy: Secondary | ICD-10-CM | POA: Diagnosis not present

## 2018-04-30 DIAGNOSIS — I70235 Atherosclerosis of native arteries of right leg with ulceration of other part of foot: Secondary | ICD-10-CM | POA: Insufficient documentation

## 2018-04-30 DIAGNOSIS — Z79899 Other long term (current) drug therapy: Secondary | ICD-10-CM | POA: Diagnosis not present

## 2018-04-30 DIAGNOSIS — Z87891 Personal history of nicotine dependence: Secondary | ICD-10-CM | POA: Diagnosis not present

## 2018-04-30 DIAGNOSIS — Z7982 Long term (current) use of aspirin: Secondary | ICD-10-CM | POA: Insufficient documentation

## 2018-04-30 DIAGNOSIS — Z951 Presence of aortocoronary bypass graft: Secondary | ICD-10-CM | POA: Insufficient documentation

## 2018-04-30 DIAGNOSIS — Z9889 Other specified postprocedural states: Secondary | ICD-10-CM | POA: Insufficient documentation

## 2018-04-30 DIAGNOSIS — Z992 Dependence on renal dialysis: Secondary | ICD-10-CM | POA: Diagnosis not present

## 2018-04-30 DIAGNOSIS — N2581 Secondary hyperparathyroidism of renal origin: Secondary | ICD-10-CM | POA: Diagnosis not present

## 2018-04-30 DIAGNOSIS — E1122 Type 2 diabetes mellitus with diabetic chronic kidney disease: Secondary | ICD-10-CM | POA: Diagnosis not present

## 2018-04-30 DIAGNOSIS — D631 Anemia in chronic kidney disease: Secondary | ICD-10-CM | POA: Insufficient documentation

## 2018-04-30 DIAGNOSIS — Z7902 Long term (current) use of antithrombotics/antiplatelets: Secondary | ICD-10-CM | POA: Insufficient documentation

## 2018-04-30 DIAGNOSIS — I70238 Atherosclerosis of native arteries of right leg with ulceration of other part of lower right leg: Secondary | ICD-10-CM | POA: Diagnosis not present

## 2018-04-30 DIAGNOSIS — I509 Heart failure, unspecified: Secondary | ICD-10-CM | POA: Insufficient documentation

## 2018-04-30 DIAGNOSIS — E11621 Type 2 diabetes mellitus with foot ulcer: Secondary | ICD-10-CM | POA: Diagnosis not present

## 2018-04-30 DIAGNOSIS — Z794 Long term (current) use of insulin: Secondary | ICD-10-CM | POA: Insufficient documentation

## 2018-04-30 DIAGNOSIS — L97511 Non-pressure chronic ulcer of other part of right foot limited to breakdown of skin: Secondary | ICD-10-CM | POA: Insufficient documentation

## 2018-04-30 DIAGNOSIS — N186 End stage renal disease: Secondary | ICD-10-CM | POA: Insufficient documentation

## 2018-04-30 DIAGNOSIS — E1165 Type 2 diabetes mellitus with hyperglycemia: Secondary | ICD-10-CM | POA: Insufficient documentation

## 2018-04-30 HISTORY — PX: PERIPHERAL VASCULAR BALLOON ANGIOPLASTY: CATH118281

## 2018-04-30 HISTORY — PX: ABDOMINAL AORTOGRAM W/LOWER EXTREMITY: CATH118223

## 2018-04-30 LAB — GLUCOSE, CAPILLARY
GLUCOSE-CAPILLARY: 497 mg/dL — AB (ref 70–99)
GLUCOSE-CAPILLARY: 325 mg/dL — AB (ref 70–99)
Glucose-Capillary: 497 mg/dL — ABNORMAL HIGH (ref 70–99)
Glucose-Capillary: 423 mg/dL — ABNORMAL HIGH (ref 70–99)

## 2018-04-30 LAB — POCT I-STAT, CHEM 8
BUN: 45 mg/dL — AB (ref 8–23)
CALCIUM ION: 1 mmol/L — AB (ref 1.15–1.40)
CREATININE: 5.3 mg/dL — AB (ref 0.61–1.24)
Chloride: 89 mmol/L — ABNORMAL LOW (ref 98–111)
GLUCOSE: 596 mg/dL — AB (ref 70–99)
HCT: 38 % — ABNORMAL LOW (ref 39.0–52.0)
Hemoglobin: 12.9 g/dL — ABNORMAL LOW (ref 13.0–17.0)
Potassium: 5 mmol/L (ref 3.5–5.1)
Sodium: 128 mmol/L — ABNORMAL LOW (ref 135–145)
TCO2: 29 mmol/L (ref 22–32)

## 2018-04-30 LAB — POCT ACTIVATED CLOTTING TIME: ACTIVATED CLOTTING TIME: 235 s

## 2018-04-30 SURGERY — ABDOMINAL AORTOGRAM W/LOWER EXTREMITY
Anesthesia: LOCAL | Laterality: Right

## 2018-04-30 MED ORDER — CLOPIDOGREL BISULFATE 75 MG PO TABS: 75.0000 mg | ORAL_TABLET | Freq: Every day | ORAL | Status: DC

## 2018-04-30 MED ORDER — HEPARIN SODIUM (PORCINE) 1000 UNIT/ML IJ SOLN
INTRAMUSCULAR | Status: DC | PRN
  Administered 2018-04-30: 8000 [IU] via INTRAVENOUS

## 2018-04-30 MED ORDER — CLOPIDOGREL BISULFATE 300 MG PO TABS
ORAL_TABLET | ORAL | Status: AC
  Filled 2018-04-30: qty 1

## 2018-04-30 MED ORDER — NITROGLYCERIN 1 MG/10 ML FOR IR/CATH LAB
INTRA_ARTERIAL | Status: AC
  Filled 2018-04-30: qty 10

## 2018-04-30 MED ORDER — SODIUM CHLORIDE 0.9% FLUSH: 3.0000 mL | Freq: Two times a day (BID) | INTRAVENOUS | Status: DC

## 2018-04-30 MED ORDER — CLOPIDOGREL BISULFATE 75 MG PO TABS: 300.0000 mg | ORAL_TABLET | Freq: Once | ORAL | Status: DC

## 2018-04-30 MED ORDER — LIDOCAINE HCL (PF) 1 % IJ SOLN
INTRAMUSCULAR | Status: DC | PRN
  Administered 2018-04-30: 20 mL

## 2018-04-30 MED ORDER — SODIUM CHLORIDE 0.9% FLUSH: 3.0000 mL | INTRAVENOUS | Status: DC | PRN

## 2018-04-30 MED ORDER — CLOPIDOGREL BISULFATE 300 MG PO TABS
ORAL_TABLET | ORAL | Status: DC | PRN
  Administered 2018-04-30: 300 mg via ORAL

## 2018-04-30 MED ORDER — SODIUM CHLORIDE 0.9 % IV SOLN: 250.0000 mL | INTRAVENOUS | Status: DC | PRN

## 2018-04-30 MED ORDER — LIDOCAINE HCL (PF) 1 % IJ SOLN
INTRAMUSCULAR | Status: AC
  Filled 2018-04-30: qty 30

## 2018-04-30 MED ORDER — INSULIN ASPART 100 UNIT/ML ~~LOC~~ SOLN
SUBCUTANEOUS | Status: AC
  Administered 2018-04-30: 10 [IU] via SUBCUTANEOUS
  Filled 2018-04-30: qty 1

## 2018-04-30 MED ORDER — INSULIN ASPART 100 UNIT/ML ~~LOC~~ SOLN
5.0000 [IU] | Freq: Once | SUBCUTANEOUS | Status: AC
  Administered 2018-04-30: 5 [IU] via SUBCUTANEOUS
  Filled 2018-04-30: qty 0.05

## 2018-04-30 MED ORDER — HEPARIN (PORCINE) IN NACL 1000-0.9 UT/500ML-% IV SOLN
INTRAVENOUS | Status: AC
  Filled 2018-04-30: qty 1000

## 2018-04-30 MED ORDER — ACETAMINOPHEN 325 MG PO TABS: 650.0000 mg | ORAL_TABLET | ORAL | Status: DC | PRN

## 2018-04-30 MED ORDER — IODIXANOL 320 MG/ML IV SOLN
INTRAVENOUS | Status: DC | PRN
  Administered 2018-04-30: 85 mL via INTRA_ARTERIAL

## 2018-04-30 MED ORDER — LABETALOL HCL 5 MG/ML IV SOLN: 10.0000 mg | INTRAVENOUS | Status: DC | PRN

## 2018-04-30 MED ORDER — ONDANSETRON HCL 4 MG/2ML IJ SOLN: 4.0000 mg | Freq: Four times a day (QID) | INTRAMUSCULAR | Status: DC | PRN

## 2018-04-30 MED ORDER — INSULIN ASPART 100 UNIT/ML ~~LOC~~ SOLN
10.0000 [IU] | Freq: Once | SUBCUTANEOUS | Status: AC
  Administered 2018-04-30: 10 [IU] via SUBCUTANEOUS
  Filled 2018-04-30: qty 0.1

## 2018-04-30 MED ORDER — INSULIN ASPART 100 UNIT/ML ~~LOC~~ SOLN
SUBCUTANEOUS | Status: AC
  Filled 2018-04-30: qty 1

## 2018-04-30 MED ORDER — HEPARIN SODIUM (PORCINE) 1000 UNIT/ML IJ SOLN
INTRAMUSCULAR | Status: AC
  Filled 2018-04-30: qty 1

## 2018-04-30 MED ORDER — NITROGLYCERIN 1 MG/10 ML FOR IR/CATH LAB
INTRA_ARTERIAL | Status: DC | PRN
  Administered 2018-04-30 (×2): 300 ug via INTRA_ARTERIAL

## 2018-04-30 MED ORDER — HYDRALAZINE HCL 20 MG/ML IJ SOLN: 5.0000 mg | INTRAMUSCULAR | Status: DC | PRN

## 2018-04-30 MED ORDER — HEPARIN (PORCINE) IN NACL 1000-0.9 UT/500ML-% IV SOLN
INTRAVENOUS | Status: DC | PRN
  Administered 2018-04-30 (×2): 500 mL

## 2018-04-30 MED ORDER — CLOPIDOGREL BISULFATE 75 MG PO TABS: 75.0000 mg | ORAL_TABLET | Freq: Every day | ORAL | 11 refills | Status: AC

## 2018-04-30 SURGICAL SUPPLY — 17 items
BALLN STERLING OTW 2X20X150 (BALLOONS) ×3
BALLOON STERLING OTW 2X20X150 (BALLOONS) ×2 IMPLANT
CATH CXI SUPP ANG 2.6FR 150CM (CATHETERS) ×3 IMPLANT
CATH OMNI FLUSH 5F 65CM (CATHETERS) ×3 IMPLANT
DEVICE CLOSURE MYNXGRIP 5F (Vascular Products) ×3 IMPLANT
KIT ENCORE 26 ADVANTAGE (KITS) ×3 IMPLANT
KIT MICROPUNCTURE NIT STIFF (SHEATH) ×3 IMPLANT
KIT PV (KITS) ×3 IMPLANT
SHEATH FLEX ANSEL ANG 5F 45CM (SHEATH) ×3 IMPLANT
SHEATH PINNACLE 5F 10CM (SHEATH) ×3 IMPLANT
SHEATH PROBE COVER 6X72 (BAG) ×3 IMPLANT
SYR MEDRAD MARK V 150ML (SYRINGE) ×3 IMPLANT
TRANSDUCER W/STOPCOCK (MISCELLANEOUS) ×3 IMPLANT
TRAY PV CATH (CUSTOM PROCEDURE TRAY) ×3 IMPLANT
WIRE BENTSON .035X145CM (WIRE) ×3 IMPLANT
WIRE G V18X300CM (WIRE) ×3 IMPLANT
WIRE ROSEN-J .035X180CM (WIRE) ×3 IMPLANT

## 2018-04-30 NOTE — H&P (Signed)
History and Physical Interval Note:  04/30/2018 8:50 AM  John Ortiz  has presented today for surgery, with the diagnosis of pvd ulcer  The various methods of treatment have been discussed with the patient and family. After consideration of risks, benefits and other options for treatment, the patient has consented to  Procedure(s): ABDOMINAL AORTOGRAM W/LOWER EXTREMITY (N/A) as a surgical intervention .  The patient's history has been reviewed, patient examined, no change in status, stable for surgery.  I have reviewed the patient's chart and labs.  Questions were answered to the patient's satisfaction.     RLE arteriogram - nonhealing diabetic ulcer.  Noncompressible ABI.  John Ortiz  Patient name: John Ortiz            MRN: [id number]        DOB: Aug 17, 1946          Sex: male  REASON FOR CONSULT: Left upper extremity aneurysmal AV fistula and right lower extremity diabetic ulcer  HPI: John Ortiz is a 72 y.o. 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  Regarding the aneurysm at his fistula, the patient states the fistula was initially placed in [place] about 10 to 15 years ago and he states they transposed a vein in his upper arm.  He is still using the fistula at this time.  He states he has had this aneurysm for over a year and it has remained unchanged over that timeline.  He said no bleeding issues or wound issues over the fistula itself.  No pain.  No difficulty with cannulation.  They are still using the fistula at this time without any issues. Regarding his right foot wound he states that the wound has been present for a number of years.  He denies any previous lower extremity revascularization procedures including no previous arteriogram or other surgery.   He denies any severe rest pain in the foot other than that the wound will not heal.  He has undergone left great toe amp that has healed years ago. Otherwise he denies any current tobacco abuse.  He admits that his diabetes has been poorly controlled.  He is still living independently at his home in [place].      Past Medical History:  Diagnosis Date  . Anemia in CKD (chronic kidney disease)   . Aneurysm (HCC)    Left arm  . CHF (congestive heart failure) ([place])   . Chronic kidney disease, stage V ([place])   . Coagulation defect ([place])   . Diabetes mellitus type 2, uncomplicated ([place])   . Diarrhea   . Fluid overload   . Hypercalcemia   . Iron deficiency anemia, unspecified   . Non-healing ulcer of foot ([place])    Right foot  . Pain   . Pruritus   . Renal osteodystrophy   . Secondary hyperparathyroidism of renal origin ([place])   . Shortness of breath   . Type 2 diabetes mellitus with hypoglycemia without coma ([place])   . Unspecified protein-calorie malnutrition ([place])          Past Surgical History:  Procedure Laterality Date  . av fistula transposed/ Above Elbow      History reviewed. No pertinent family history.  SOCIAL HISTORY: Social History        Socioeconomic History  .  Marital status: Married    Spouse name: Not on file  . Number of children: Not on file  . Years of education: Not on file  . Highest education level: Not on file  Occupational History  . Not on file  Social Needs  . Financial resource strain: Not on file  . Food insecurity:    Worry: Not on file    Inability: Not on file  . Transportation needs:    Medical: Not on file    Non-medical: Not on file  Tobacco Use  . Smoking status: Former Research scientist (life sciences)  . Smokeless tobacco: Never Used  Substance and Sexual Activity  . Alcohol use: Not on file  . Drug use: Not on file  . Sexual activity: Not on file  Lifestyle  . Physical activity:    Days per week: Not on  file    Minutes per session: Not on file  . Stress: Not on file  Relationships  . Social connections:    Talks on phone: Not on file    Gets together: Not on file    Attends religious service: Not on file    Active member of club or organization: Not on file    Attends meetings of clubs or organizations: Not on file    Relationship status: Not on file  . Intimate partner violence:    Fear of current or ex partner: Not on file    Emotionally abused: Not on file    Physically abused: Not on file    Forced sexual activity: Not on file  Other Topics Concern  . Not on file  Social History Narrative  . Not on file    No Known Allergies        Current Outpatient Medications  Medication Sig Dispense Refill  . aspirin EC 81 MG tablet Take 81 mg by mouth daily.    . calcium acetate, Phos Binder, (PHOSLYRA) 667 MG/5ML SOLN Take by mouth 3 (three) times daily with meals.    . clopidogrel (PLAVIX) 75 MG tablet Take 75 mg by mouth daily.    . Cyanocobalamin (B-12) 2500 MCG TABS Take by mouth.    Marland Kitchen FLUoxetine (PROZAC) 20 MG capsule Take 20 mg by mouth daily.    . furosemide (LASIX) 40 MG tablet Take 40 mg by mouth.    . Insulin Aspart (NOVOLOG FLEXPEN [place]) Inject 7 Units into the skin.    Marland Kitchen insulin glargine (LANTUS) 100 UNIT/ML injection Inject into the skin daily.    . simvastatin (ZOCOR) 80 MG tablet Take 80 mg by mouth daily.     No current facility-administered medications for this visit.     REVIEW OF SYSTEMS:  [X]  denotes positive finding, [ ]  denotes negative finding Cardiac  Comments:  Chest pain or chest pressure:    Shortness of breath upon exertion:    Short of breath when lying flat:    Irregular heart rhythm:        Vascular    Pain in calf, thigh, or hip brought on by ambulation: x   Pain in feet at night that wakes you up from your sleep:     Blood clot in your veins:    Leg swelling:     Wound on  right foot x   Pulmonary    Oxygen at home:    Productive cough:     Wheezing:         Neurologic    Sudden weakness in arms or legs:  Sudden numbness in arms or legs:     Sudden onset of difficulty speaking or slurred speech:    Temporary loss of vision in one eye:     Problems with dizziness:         Gastrointestinal    Blood in stool:     Vomited blood:         Genitourinary    Burning when urinating:     Blood in urine:        Psychiatric    Major depression:         Hematologic    Bleeding problems:    Problems with blood clotting too easily:        Skin    Rashes or ulcers: x       Constitutional    Fever or chills:      PHYSICAL EXAM:    Vitals:   04/14/18 1109  BP: 123/71  Pulse: 93  Resp: 20  Temp: 98.8 F (37.1 C)  TempSrc: Oral  SpO2: 99%  Weight: 79.2 kg  Height: 5\' 6"  (1.676 m)    GENERAL: The patient is a well-nourished male, in no acute distress. The vital signs are documented above. CARDIAC: There is a regular rate and rhythm.  VASCULAR:  2+ palpable radial pulse to the bilateral upper extremities. 2+ palpable brachial pulse of the bilateral upper extremities. Left upper arm vein base fistula with tandem aneurysms.  The more distal aneurysm is probably 4 cm in diameter.  The associated aneurysm more proximal is much smaller.  Overlying skin appears viable.  He has a great thrill in the fistula. 2+ palpable femoral pulses in the bilateral groins. Old scar in the right groin that he states is from a hernia repair and not a vascular based procedure. No palpable popliteal or pedal pulses in either foot. He does have DP and PT signals in both feet. PULMONARY: There is good air exchange bilaterally without wheezing or rales. ABDOMEN: Soft and non-tender with normal pitched bowel sounds.  MUSCULOSKELETAL: There are no major deformities or cyanosis. NEUROLOGIC: No  focal weakness or paresthesias are detected. SKIN: There are no ulcers or rashes noted. PSYCHIATRIC: The patient has a normal affect.  DATA:   I have intimately reviewed his noninvasive imaging and he has noncompressible ABIs of the bilateral lower extremities.  His right great toe pressure is 75.  He has biphasic waveforms at the right ankle versus triphasic waveforms at the left ankle.  Assessment/Plan:  72 year old male with history of end-stage renal disease as well as poorly controlled diabetes that presents for evaluation of an aneurysmal left upper extremity fistula as well as a non-healing diabetic ulcer on the bottom of his right foot.  I discussed with the patient that I think his foot ulcer is the most pressing issue and he has critical limb ischemia.  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  They have been able to access his fistula above the aneurysm without any issue.  He said no bleeding issues or issues with skin integrity over the aneurysm itself.  Certainly would be happy to revise this in the future but think we can wait given no immediate issues.  I told him that whenever we do revise this it would likely require a temporary dialysis catheter.  Regarding his right lower extremity wound, he states it has been present for years and has been nonhealing.  He has poorly controlled  diabetes and has noncompressible ABIs.  He has nonpalpable pedal pulses on exam but does have some biphasic signals at the ankle.  I recommended a right lower extremity arteriogram to evaluate his runoff in the right leg with possible intervention.  His toe pressure is 75 in the right foot which is marginal but given the chronicity of his wound I think it is time to go ahead with an invasive procedure.  Will arrange arteriogram at next available Thursday on a non-dialysis day.   John Heck, MD [facility] Office: [phone]

## 2018-04-30 NOTE — Op Note (Signed)
Patient name: John Ortiz MRN: [id number] DOB: [birth year] Sex: male  04/30/2018 Pre-operative Diagnosis: Nonhealing wound of the right lower extremity with noncompressible ABIs Post-operative diagnosis:  Same Surgeon:  Marty Heck, MD Procedure Performed: 1.  Ultrasound-guided access of the left common femoral artery 2.  Aortogram 3.  Right lower extremity arteriogram with selection of third order branches 4.  Right anterior tibial artery angioplasty (2 mm x 20 mm Sterling) 5.  Mynx closure of the left common femoral artery  Indications: Patient is 72 year old male the recently presented to clinic with a nonhealing right lower extremity wound.  He had noncompressible ABIs with a marginal toe pressure.  Given the chronicity of his wound we recommended right lower extremity arteriogram to evaluate his inflow.  It should also be noted the patient has uncontrolled diabetes and this is likely contributing to failure of his wound to heal as well.  Findings:  1.  Aortogram showed no significant aortoiliac stenosis.  There was some calcification of the right common iliac artery but after measuring pressure gradient here there was no significant change from the aorta to the right external iliac. 2.  Right lower extremity arteriogram showed widely patent SFA and above-knee popliteal artery.  There was calcification in the below-knee popliteal artery but contrast briskly flow past this lesion.  There was also heavy calcification in the tibioperoneal trunk but contrast also flowed briskly past this.  Patient had a patent trifurcation with three-vessel runoff in the right lower extremity.  There was a focal high-grade stenosis greater than 70% in the mid right anterior tibial artery.  The peroneal artery became small and diminutive distally.  The posterior tibial was widely patent into the foot with in-line flow.   Procedure:  The patient was identified in the holding area and taken to room  8.  The patient was then placed supine on the table and prepped and draped in the usual sterile fashion.  A time out was called.  Ultrasound was used to evaluate the left common femoral artery.  It was patent .  A digital ultrasound image was acquired.  A micropuncture needle was used to access the left common femoral artery under ultrasound guidance.  An 018 wire was advanced without resistance and a micropuncture sheath was placed.  The 018 wire was removed and a benson wire was placed.  The micropuncture sheath was exchanged for a 5 french sheath.  An omniflush catheter was advanced over the wire to the level of L-1.  An abdominal angiogram was obtained.  Next, using the omniflush catheter and a benson wire, the aortic bifurcation was crossed and the catheter was placed into theright external iliac artery and right runoff was obtained.  Ultimately after evaluating his runoff he had heavily calcified arteries throughout his right lower extremity.  There was some calcification in the below-knee popliteal artery and tibioperoneal trunk but contrast flow was brisk here and I did not feel this lesion needed to be treated given patent three-vessel runoff with brisk inflow past the calcification.  Patient had in-line flow through widely patent posterior tibial artery.  There was a focal high-grade stenosis greater than 70% of the mid right anterior tibial artery.  Given that his wound is on the distal plantar surface of his foot I thought treating his anterior tibial stenosis may improve inflow via angiosome perfusion.  At that point in time I used a Rosen wire threaded through the left groin to exchange for a long 5  [place] Ansell sheath.  Patient was then given 8000 unit of IV heparin and ACTs were monitored throughout the case.  Then used a CSI catheter and a V 18 wire to cross the right SFA popliteal artery and ultimately selected the anterior tibial artery over which we would advance the catheter and wire.  I  advanced my wire and catheter into the distal anterior tibial artery did perform a hand injection to confirm I was in the true lumen.  Then over the V 18 wire we selected a 2.0 mm x 20 mm Sterling antroplasty balloon that was inflated to nominal pressure for 2 minutes.  This vessel measured about 1.9 mm and was heavily calcified and I did not feel that being more aggressive was appropriate.  We did give 600 micrograms nitroglycerin throughout the case.  A  final hand injected angiogram through the sheath showed a patent trifurcation with patent runoff via the posterior tibial and dorsalis pedis in the right foot after intervention.  At this point in time our wires and catheter were removed and we exchanged for a short 5 French sheath in the left groin.  A mynx closure device was deployed.  He was taken to PACU in stable condition with DP and PT signals.     Marty Heck, MD [facility] Office: [phone] Pager: [place]

## 2018-04-30 NOTE — Discharge Instructions (Signed)
° °  DRINK PLENTY OF FLUIDS FOR THE NEXT 2-3 DAYS TO KEEP HYDRATED.  Femoral Site Care Refer to this sheet in the next few weeks. These instructions provide you with information about caring for yourself after your procedure. Your health care provider may also give you more specific instructions. Your treatment has been planned according to current medical practices, but problems sometimes occur. Call your health care provider if you have any problems or questions after your procedure. What can I expect after the procedure? After your procedure, it is typical to have the following:  Bruising at the site that usually fades within 1-2 weeks.  Blood collecting in the tissue (hematoma) that may be painful to the touch. It should usually decrease in size and tenderness within 1-2 weeks.  Follow these instructions at home:  Take medicines only as directed by your health care provider.  You may shower 24-48 hours after the procedure or as directed by your health care provider. Remove the bandage (dressing) and gently wash the site with plain soap and water. Pat the area dry with a clean towel. Do not rub the site, because this may cause bleeding.  Do not take baths, swim, or use a hot tub until your health care provider approves.  Check your insertion site every day for redness, swelling, or drainage.  Do not apply powder or lotion to the site.  Limit use of stairs to twice a day for the first 2-3 days or as directed by your health care provider.  Do not squat for the first 2-3 days or as directed by your health care provider.  Do not lift over 10 lb (4.5 kg) for 5 days after your procedure or as directed by your health care provider.  Ask your health care provider when it is okay to: ? Return to work or school. ? Resume usual physical activities or sports. ? Resume sexual activity.  Do not drive home if you are discharged the same day as the procedure. Have someone else drive you.  You  may drive 24 hours after the procedure unless otherwise instructed by your health care provider.  Do not operate machinery or power tools for 24 hours after the procedure or as directed by your health care provider.  If your procedure was done as an outpatient procedure, which means that you went home the same day as your procedure, a responsible adult should be with you for the first 24 hours after you arrive home.  Keep all follow-up visits as directed by your health care provider. This is important. Contact a health care provider if:  You have a fever.  You have chills.  You have increased bleeding from the site. Hold pressure on the site. Get help right away if:  You have unusual pain at the site.  You have redness, warmth, or swelling at the site.  You have drainage (other than a small amount of blood on the dressing) from the site.  The site is bleeding, and the bleeding does not stop after 30 minutes of holding steady pressure on the site.  Your leg or foot becomes pale, cool, tingly, or numb. This information is not intended to replace advice given to you by your health care provider. Make sure you discuss any questions you have with your health care provider. Document Released: 04/15/2014 Document Revised: 01/18/2016 Document Reviewed: 03/01/2014 Elsevier Interactive Patient Education  [employer].

## 2018-05-01 ENCOUNTER — Telehealth: Payer: Self-pay | Admitting: Vascular Surgery

## 2018-05-01 ENCOUNTER — Encounter (HOSPITAL_COMMUNITY): Payer: Self-pay | Admitting: Vascular Surgery

## 2018-05-01 NOTE — Telephone Encounter (Signed)
sch appt spk to pt wife 06/02/18 11am ABI 1130am p/o MD

## 2018-05-04 ENCOUNTER — Other Ambulatory Visit: Payer: Self-pay

## 2018-05-04 ENCOUNTER — Ambulatory Visit: Payer: PRIVATE HEALTH INSURANCE | Admitting: Podiatry

## 2018-05-04 DIAGNOSIS — I70229 Atherosclerosis of native arteries of extremities with rest pain, unspecified extremity: Secondary | ICD-10-CM

## 2018-05-04 DIAGNOSIS — I998 Other disorder of circulatory system: Secondary | ICD-10-CM

## 2018-05-04 DIAGNOSIS — I739 Peripheral vascular disease, unspecified: Secondary | ICD-10-CM

## 2018-05-11 ENCOUNTER — Ambulatory Visit (INDEPENDENT_AMBULATORY_CARE_PROVIDER_SITE_OTHER): Payer: Medicare Other | Admitting: Podiatry

## 2018-05-11 ENCOUNTER — Encounter: Payer: Self-pay | Admitting: Podiatry

## 2018-05-11 VITALS — BP 92/54 | HR 83 | Resp 15

## 2018-05-11 DIAGNOSIS — I739 Peripheral vascular disease, unspecified: Secondary | ICD-10-CM | POA: Diagnosis not present

## 2018-05-11 DIAGNOSIS — L97511 Non-pressure chronic ulcer of other part of right foot limited to breakdown of skin: Secondary | ICD-10-CM | POA: Diagnosis not present

## 2018-05-11 DIAGNOSIS — I7090 Unspecified atherosclerosis: Secondary | ICD-10-CM

## 2018-05-11 DIAGNOSIS — E08621 Diabetes mellitus due to underlying condition with foot ulcer: Secondary | ICD-10-CM

## 2018-05-11 NOTE — Progress Notes (Signed)
Subjective:  Patient ID: John Ortiz, male    DOB: [birth year],  MRN: [id number]  Chief Complaint  Patient presents with  . Foot Ulcer    F/U R ulcer PT. stated," it looks about the same size, but on the outside it looks dry and patey." -w/ bloody draiange, and redness -pt denies N/V/?FCh/swelling    72 y.o. male presents for wound care.  Thinks the wound is about the same side and that it looks somewhat pasty outside the wound.  Underwent recent vascular procedure.  States that he is surprised but his foot does feel warmer.  Review of Systems: Negative except as noted in the HPI. Denies N/V/F/Ch.  Past Medical History:  Diagnosis Date  . Anemia in CKD (chronic kidney disease)   . Aneurysm (HCC)    Left arm  . CHF (congestive heart failure) ([place])   . Chronic kidney disease, stage V ([place])   . Coagulation defect ([place])   . Diabetes mellitus type 2, uncomplicated ([place])   . Diarrhea   . Fluid overload   . Hypercalcemia   . Iron deficiency anemia, unspecified   . Non-healing ulcer of foot ([place])    Right foot  . Pain   . Pruritus   . Renal osteodystrophy   . Secondary hyperparathyroidism of renal origin ([place])   . Shortness of breath   . Type 2 diabetes mellitus with hypoglycemia without coma ([place])   . Unspecified protein-calorie malnutrition ([place])     Current Outpatient Medications:  .  aspirin EC 81 MG tablet, Take 81 mg by mouth daily., Disp: , Rfl:  .  calcium acetate (PHOSLO) 667 MG capsule, Take 2,001 mg by mouth 3 (three) times daily with meals., Disp: , Rfl:  .  clopidogrel (PLAVIX) 75 MG tablet, Take 1 tablet (75 mg total) by mouth daily., Disp: 30 tablet, Rfl: 11 .  Cyanocobalamin (B-12) 500 MCG TABS, Take 500 mcg by mouth 3 (three) times daily. , Disp: , Rfl:  .  FLUoxetine (PROZAC) 20 MG capsule, Take 20 mg by mouth daily., Disp: , Rfl:  .  furosemide (LASIX) 40 MG tablet, Take 40 mg by mouth daily. , Disp: , Rfl:  .  insulin aspart (NOVOLOG FLEXPEN) 100  UNIT/ML FlexPen, Inject 7-15 Units into the skin 3 (three) times daily with meals. Per sliding scale, Disp: , Rfl:  .  insulin glargine (LANTUS) 100 UNIT/ML injection, Inject 4 Units into the skin 2 (two) times daily. , Disp: , Rfl:  .  simvastatin (ZOCOR) 80 MG tablet, Take 80 mg by mouth daily., Disp: , Rfl:   Social History   Tobacco Use  Smoking Status Former Smoker  Smokeless Tobacco Never Used    No Known Allergies Objective:   Vitals:   05/11/18 1504  BP: (!) 92/54  Pulse: 83  Resp: 15   There is no height or weight on file to calculate BMI. Constitutional Well developed. Well nourished.  Vascular Dorsalis pedis pulses palpable bilaterally. Posterior tibial pulses palpable bilaterally. Capillary refill normal to all digits.  No cyanosis or clubbing noted. Pedal hair growth normal.  Neurologic Normal speech. Oriented to person, place, and time. Protective sensation absent  Dermatologic Wound Location: R 1st MPJ Wound Base: Granular/Healthy Peri-wound: Calloused, macerated Exudate: Scant/small amount Serosanguinous exudate Wound Measurements: -04/13/18: 4.5x3 post-debridement -04/28/18: 2.5x2.5 post-debridement -05/11/18: 2x1.5 post-debridement   Orthopedic: No pain to palpation either foot.   Radiographs: None today Assessment:   1. PAD (peripheral artery disease) ([place])   2.  Arterial calcification   3. Diabetic ulcer of other part of right foot associated with diabetes mellitus due to underlying condition, limited to breakdown of skin [facility])    Plan:  Patient was evaluated and treated and all questions answered.  Ulcer R 1st MPJ -Debridement as below. -Dressed with Prisma, DSD. -Continue off-loading with surgical shoe. -Will set up [place] for patient for dressing changes thrice weekly.  Procedure: Excisional Debridement of Wound Rationale: Removal of non-viable soft tissue from the wound to promote healing.  Anesthesia: none Pre-Debridement Wound  Measurements: 2 cm x 1 cm x 0.3 cm  Post-Debridement Wound Measurements: 2 cm x 1.5 cm x  cm  Type of Debridement: Sharp Excisional Tissue Removed: Non-viable soft tissue Depth of Debridement: subcutaneous tissue. Technique: Sharp excisional debridement to bleeding, viable wound base.  Dressing: Dry, sterile, compression dressing. Disposition: Patient tolerated procedure well. Patient to return in 1 week for follow-up.  PAD -Records reviewed.  Underwent angiography with stenting of the anterior tibial artery.  Calcifications noted but no flow-limiting stenosis.   Return in about 2 weeks (around 05/25/2018) for Wound Care, Right.

## 2018-05-18 ENCOUNTER — Ambulatory Visit: Payer: PRIVATE HEALTH INSURANCE | Admitting: Podiatry

## 2018-05-25 ENCOUNTER — Ambulatory Visit: Payer: Medicare Other | Admitting: Podiatry

## 2018-05-26 ENCOUNTER — Ambulatory Visit (INDEPENDENT_AMBULATORY_CARE_PROVIDER_SITE_OTHER): Payer: Medicare Other

## 2018-05-26 ENCOUNTER — Ambulatory Visit (INDEPENDENT_AMBULATORY_CARE_PROVIDER_SITE_OTHER): Payer: Medicare Other | Admitting: Podiatry

## 2018-05-26 ENCOUNTER — Encounter: Payer: Self-pay | Admitting: Podiatry

## 2018-05-26 VITALS — BP 128/55 | HR 78 | Temp 97.8°F | Resp 16

## 2018-05-26 DIAGNOSIS — S9002XA Contusion of left ankle, initial encounter: Secondary | ICD-10-CM

## 2018-05-26 DIAGNOSIS — R600 Localized edema: Secondary | ICD-10-CM

## 2018-05-26 DIAGNOSIS — E08621 Diabetes mellitus due to underlying condition with foot ulcer: Secondary | ICD-10-CM

## 2018-05-26 DIAGNOSIS — L97511 Non-pressure chronic ulcer of other part of right foot limited to breakdown of skin: Secondary | ICD-10-CM | POA: Diagnosis not present

## 2018-05-26 NOTE — Progress Notes (Signed)
Subjective:  Patient ID: John Ortiz, male    DOB: 07-May-1946,  MRN: [id number]  Chief Complaint  Patient presents with  . Foot Ulcer    F/U R foot ulcer Pt. stated," I can't look at it, so I don't know if it's improving or not." -w/ bloody drainage -pt denies N/V/F/Ch  . Foot Injury    L ankle (fell going down the hill, rolled and twisted ankle x 4 days; 7/101 sharp pain Tx: topical pain cream    72 y.o. male presents for wound care.  Does not know whether the wound is improving or not because he cannot look at it.  States that he has a new issue which is an injury to the left ankle states that he fell going down the hill rolled his ankle twist today.  Reports about 10 sharp pain for the past 4 days.  Has tried topical pain cream without relief.  Review of Systems: Negative except as noted in the HPI. Denies N/V/F/Ch.  Past Medical History:  Diagnosis Date  . Anemia in CKD (chronic kidney disease)   . Aneurysm (HCC)    Left arm  . CHF (congestive heart failure) ([place])   . Chronic kidney disease, stage V ([place])   . Coagulation defect ([place])   . Diabetes mellitus type 2, uncomplicated ([place])   . Diarrhea   . Fluid overload   . Hypercalcemia   . Iron deficiency anemia, unspecified   . Non-healing ulcer of foot ([place])    Right foot  . Pain   . Pruritus   . Renal osteodystrophy   . Secondary hyperparathyroidism of renal origin ([place])   . Shortness of breath   . Type 2 diabetes mellitus with hypoglycemia without coma ([place])   . Unspecified protein-calorie malnutrition ([place])     Current Outpatient Medications:  .  aspirin EC 81 MG tablet, Take 81 mg by mouth daily., Disp: , Rfl:  .  calcium acetate (PHOSLO) 667 MG capsule, Take 2,001 mg by mouth 3 (three) times daily with meals., Disp: , Rfl:  .  clopidogrel (PLAVIX) 75 MG tablet, Take 1 tablet (75 mg total) by mouth daily., Disp: 30 tablet, Rfl: 11 .  Cyanocobalamin (B-12) 500 MCG TABS, Take 500 mcg by mouth 3 (three) times  daily. , Disp: , Rfl:  .  FLUoxetine (PROZAC) 20 MG capsule, Take 20 mg by mouth daily., Disp: , Rfl:  .  furosemide (LASIX) 40 MG tablet, Take 40 mg by mouth daily. , Disp: , Rfl:  .  insulin aspart (NOVOLOG FLEXPEN) 100 UNIT/ML FlexPen, Inject 7-15 Units into the skin 3 (three) times daily with meals. Per sliding scale, Disp: , Rfl:  .  insulin glargine (LANTUS) 100 UNIT/ML injection, Inject 4 Units into the skin 2 (two) times daily. , Disp: , Rfl:  .  simvastatin (ZOCOR) 80 MG tablet, Take 80 mg by mouth daily., Disp: , Rfl:   Social History   Tobacco Use  Smoking Status Former Smoker  Smokeless Tobacco Never Used    No Known Allergies Objective:   Vitals:   05/26/18 0934  BP: (!) 128/55  Pulse: 78  Resp: 16  Temp: 97.8 F (36.6 C)   There is no height or weight on file to calculate BMI. Constitutional Well developed. Well nourished.  Vascular Dorsalis pedis pulses palpable bilaterally. Posterior tibial pulses palpable bilaterally. Capillary refill normal to all digits.  No cyanosis or clubbing noted. Pedal hair growth normal.  Neurologic Normal speech. Oriented to  person, place, and time. Protective sensation absent  Dermatologic Wound Location: R 1st MPJ Wound Base: Granular/Healthy Peri-wound: Calloused, macerated Exudate: Scant/small amount Serosanguinous exudate Wound Measurements: -04/13/18: 4.5x3 post-debridement -04/28/18: 2.5x2.5 post-debridement -05/11/18: 2x1.5 post-debridement -05/26/18: 2x1.5 post-debridement   Orthopedic: No pain to palpation either foot. Contusion left ankle without bony tenderness   Radiographs: None today Assessment:   1. Contusion of left ankle, initial encounter   2. Localized edema   3. Diabetic ulcer of other part of right foot associated with diabetes mellitus due to underlying condition, limited to breakdown of skin [facility])    Plan:  Patient was evaluated and treated and all questions answered.  Ulcer R 1st  MPJ -Debridement as below. -Dressed with honey, DSD. -Continue off-loading with surgical shoe. -Does not meet criteria for home health care  Procedure: Excisional Debridement of Wound Rationale: Removal of non-viable soft tissue from the wound to promote healing.  Anesthesia: none Pre-Debridement Wound Measurements: 1.5 cm x 1.5 cm x 0.2 cm  Post-Debridement Wound Measurements: 2 cm x 1.5 cm x 0.2 cm  Type of Debridement: Sharp Excisional Tissue Removed: Non-viable soft tissue Depth of Debridement: subcutaneous tissue. Technique: Sharp excisional debridement to bleeding, viable wound base.  Dressing: Dry, sterile, compression dressing. Disposition: Patient tolerated procedure well. Patient to return in 1 week for follow-up.  Left ankle contusion with edema -Unna boot and compressive dressing applied first reduction of swelling and pain  Return in about 2 weeks (around 06/09/2018) for Wound Care, Right; ankle injury , Left.

## 2018-05-28 ENCOUNTER — Other Ambulatory Visit: Payer: Self-pay | Admitting: Podiatry

## 2018-05-28 DIAGNOSIS — S9002XA Contusion of left ankle, initial encounter: Secondary | ICD-10-CM

## 2018-05-28 DIAGNOSIS — E08621 Diabetes mellitus due to underlying condition with foot ulcer: Secondary | ICD-10-CM

## 2018-05-28 DIAGNOSIS — R600 Localized edema: Secondary | ICD-10-CM

## 2018-05-28 DIAGNOSIS — L97511 Non-pressure chronic ulcer of other part of right foot limited to breakdown of skin: Secondary | ICD-10-CM

## 2018-06-02 ENCOUNTER — Ambulatory Visit (HOSPITAL_COMMUNITY)
Admission: RE | Admit: 2018-06-02 | Discharge: 2018-06-02 | Disposition: A | Discharge status: Home / Self Care | Payer: Medicare Other | Source: Ambulatory Visit | Attending: Vascular Surgery | Admitting: Vascular Surgery

## 2018-06-02 ENCOUNTER — Encounter: Payer: Self-pay | Admitting: Vascular Surgery

## 2018-06-02 ENCOUNTER — Ambulatory Visit (INDEPENDENT_AMBULATORY_CARE_PROVIDER_SITE_OTHER): Payer: Medicare Other | Admitting: Vascular Surgery

## 2018-06-02 VITALS — BP 96/55 | HR 76 | Temp 97.5°F | Resp 18 | Ht 68.0 in | Wt 171.0 lb

## 2018-06-02 DIAGNOSIS — I998 Other disorder of circulatory system: Secondary | ICD-10-CM | POA: Insufficient documentation

## 2018-06-02 DIAGNOSIS — I739 Peripheral vascular disease, unspecified: Secondary | ICD-10-CM | POA: Insufficient documentation

## 2018-06-02 DIAGNOSIS — I70229 Atherosclerosis of native arteries of extremities with rest pain, unspecified extremity: Secondary | ICD-10-CM

## 2018-06-02 NOTE — Progress Notes (Signed)
Patient name: John Ortiz MRN: [id number] DOB: 1945/11/17 Sex: male  REASON FOR VISIT: Follow-up status post right lower extremity angiogram with AT angioplasty  HPI: John Ortiz is a 72 y.o. male with history of end-stage renal disease, diabetes, CHF that presents for follow-up after right lower extremity angiogram with angioplasty of his anterior tibial artery.  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 Focal AT stenosis treated during angioplasty. ABI performed today.  States still going to podiatry for debridement of neuropathic ulcer on right foot. No new wounds.  No rest pain.  Past Medical History:  Diagnosis Date  . Anemia in CKD (chronic kidney disease)   . Aneurysm (HCC)    Left arm  . CHF (congestive heart failure) ([place])   . Chronic kidney disease, stage V ([place])   . Coagulation defect ([place])   . Diabetes mellitus type 2, uncomplicated ([place])   . Diarrhea   . Fluid overload   . Hypercalcemia   . Iron deficiency anemia, unspecified   . Non-healing ulcer of foot ([place])    Right foot  . Pain   . Pruritus   . Renal osteodystrophy   . Secondary hyperparathyroidism of renal origin ([place])   . Shortness of breath   . Type 2 diabetes mellitus with hypoglycemia without coma ([place])   . Unspecified protein-calorie malnutrition ([place])     Past Surgical History:  Procedure Laterality Date  . ABDOMINAL AORTOGRAM W/LOWER EXTREMITY N/A 04/30/2018   Procedure: ABDOMINAL AORTOGRAM W/LOWER EXTREMITY;  Surgeon: Marty Heck, MD;  Location: [facility];  Service: Cardiovascular;  Laterality: N/A;  . av fistula transposed/ Above [place]bow    . PERIPHERAL VASCULAR BALLOON ANGIOPLASTY Right 04/30/2018   Procedure: PERIPHERAL VASCULAR BALLOON ANGIOPLASTY;  Surgeon: Marty Heck, MD;  Location: [facility];  Service: Cardiovascular;  Laterality: Right;  Anterior tibial    History  reviewed. No pertinent family history.  SOCIAL HISTORY: Social History   Tobacco Use  . Smoking status: Former Research scientist (life sciences)  . Smokeless tobacco: Never Used  Substance Use Topics  . Alcohol use: Never    Frequency: Never    No Known Allergies  Current Outpatient Medications  Medication Sig Dispense Refill  . aspirin EC 81 MG tablet Take 81 mg by mouth daily.    . calcium acetate (PHOSLO) 667 MG capsule Take 2,001 mg by mouth 3 (three) times daily with meals.    . clopidogrel (PLAVIX) 75 MG tablet Take 1 tablet (75 mg total) by mouth daily. 30 tablet 11  . Cyanocobalamin (B-12) 500 MCG TABS Take 500 mcg by mouth 3 (three) times daily.     Marland Kitchen FLUoxetine (PROZAC) 20 MG capsule Take 20 mg by mouth daily.    . furosemide (LASIX) 40 MG tablet Take 40 mg by mouth daily.     . insulin aspart (NOVOLOG FLEXPEN) 100 UNIT/ML FlexPen Inject 7-15 Units into the skin 3 (three) times daily with meals. Per sliding scale    . insulin glargine (LANTUS) 100 UNIT/ML injection Inject 4 Units into the skin 2 (two) times daily.     . simvastatin (ZOCOR) 80 MG tablet Take 80 mg by mouth daily.     No current facility-administered medications for this visit.     REVIEW OF SYSTEMS:  [X]  denotes positive finding, [ ]  denotes negative finding Cardiac  Comments:  Chest pain or chest pressure:    Shortness of  breath upon exertion:    Short of breath when lying flat:    Irregular heart rhythm:        Vascular    Pain in calf, thigh, or hip brought on by ambulation:    Pain in feet at night that wakes you up from your sleep:     Blood clot in your veins:    Leg swelling:         Pulmonary    Oxygen at home:    Productive cough:     Wheezing:         Neurologic    Sudden weakness in arms or legs:     Sudden numbness in arms or legs:     Sudden onset of difficulty speaking or slurred speech:    Temporary loss of vision in one eye:     Problems with dizziness:         Gastrointestinal    Blood in  stool:     Vomited blood:         Genitourinary    Burning when urinating:     Blood in urine:        Psychiatric    Major depression:         Hematologic    Bleeding problems:    Problems with blood clotting too easily:        Skin    Rashes or ulcers:        Constitutional    Fever or chills:      PHYSICAL EXAM: Vitals:   06/02/18 1133 06/02/18 1145  BP: (!) 89/51 (!) 96/55  Pulse: 78 76  Resp: 18   Temp: (!) 97.5 F (36.4 C)   TempSrc: Oral   SpO2: 98%   Weight: 77.6 kg   Height: 5\' 8"  (1.727 m)     GENERAL: The patient is a well-nourished male, in no acute distress. The vital signs are documented above. CARDIAC: There is a regular rate and rhythm.  VASCULAR:  Left groin healed after percutaneous access - no hematoma Palpable femoral pulses bilaterally DP/PT brisk signals in right foot Clean based neuropathic ulcer bottom of right foot PULMONARY: There is good air exchange bilaterally without wheezing or rales. ABDOMEN: Soft and non-tender with normal pitched bowel sounds.  MUSCULOSKELETAL: There are no major deformities or cyanosis. NEUROLOGIC: No focal weakness or paresthesias are detected. SKIN: There are no ulcers or rashes noted.  DATA:   I indepednetly reviewed his noninvasive imaging.  Today his ABIs remain noncompressible.  His TBI in the right foot is improved from 0.6 to 0.73 and his toe pressures improved from 70 to 84.  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  He has now undergone right lower extremity angiogram with intervention.  He actually had fairly decent runoff with patent tibial trifurcation and we treated anterior tibial focal stenosis.  Right toe pressure today is 84 which should be more than adequate to heal his neuropathic ulcer.  He is being followed by podiatry with good wound care.  My concern now is that his blood sugars remain in the 400-500 range and unless he has  better control of his diabetes he probably is going to have ongoing difficulty healing his wound.  We will arrange referral to primary care doctor since he states he is having trouble getting a doctor in [place].  I will see him back in 1 month for a wound check but  I'm not sure there is anything else to offer from our standpoint at this time.   Marty Heck, MD [facility] Office: [phone] Pager: [place]

## 2018-06-03 ENCOUNTER — Encounter: Payer: Self-pay | Admitting: Nephrology

## 2018-06-04 DIAGNOSIS — Z794 Long term (current) use of insulin: Secondary | ICD-10-CM

## 2018-06-04 DIAGNOSIS — I509 Heart failure, unspecified: Secondary | ICD-10-CM | POA: Insufficient documentation

## 2018-06-04 DIAGNOSIS — R0602 Shortness of breath: Secondary | ICD-10-CM | POA: Insufficient documentation

## 2018-06-04 DIAGNOSIS — D631 Anemia in chronic kidney disease: Secondary | ICD-10-CM | POA: Insufficient documentation

## 2018-06-04 DIAGNOSIS — Z992 Dependence on renal dialysis: Secondary | ICD-10-CM

## 2018-06-04 DIAGNOSIS — E0859 Diabetes mellitus due to underlying condition with other circulatory complications: Secondary | ICD-10-CM | POA: Insufficient documentation

## 2018-06-04 DIAGNOSIS — N186 End stage renal disease: Secondary | ICD-10-CM

## 2018-06-09 ENCOUNTER — Ambulatory Visit (INDEPENDENT_AMBULATORY_CARE_PROVIDER_SITE_OTHER): Payer: Medicare Other | Admitting: Podiatry

## 2018-06-09 ENCOUNTER — Encounter: Payer: Self-pay | Admitting: Podiatry

## 2018-06-09 VITALS — BP 128/63 | HR 74 | Temp 98.0°F | Resp 16

## 2018-06-09 DIAGNOSIS — L97511 Non-pressure chronic ulcer of other part of right foot limited to breakdown of skin: Secondary | ICD-10-CM | POA: Diagnosis not present

## 2018-06-09 DIAGNOSIS — E08621 Diabetes mellitus due to underlying condition with foot ulcer: Secondary | ICD-10-CM

## 2018-06-09 NOTE — Progress Notes (Signed)
Subjective:  Patient ID: John Ortiz, male    DOB: 1946/07/18,  MRN: [id number]  Chief Complaint  Patient presents with  . Foot Ulcer    F/U R foot ulcer PT. stated," I think it's geting smaller. I just get a few seconds of pain on my toe and it's gone." Tx: bandiad ane imerin cream -pt denies N/v/f/Ch/swelling/redness -w/ light bloody discharge -FBS: 390 A1C: "IDK" PCP: K[facility] x 1 wk   . Foot Injury    F/U L ankle injury Pt. stated," it seems toe be a little bit better just w/ small amount of pain; 3/10." Tx: none    72 y.o. male presents for wound care. Thinks wound is improving. Above history confirmed with patient.  Review of Systems: Negative except as noted in the HPI. Denies N/V/F/Ch.  Past Medical History:  Diagnosis Date  . Anemia in CKD (chronic kidney disease)   . Aneurysm (HCC)    Left arm  . CHF (congestive heart failure) ([place])   . Chronic kidney disease, stage V ([place])   . Coagulation defect ([place])   . Diabetes mellitus type 2, uncomplicated ([place])   . Diarrhea   . Fluid overload   . Hypercalcemia   . Iron deficiency anemia, unspecified   . Non-healing ulcer of foot ([place])    Right foot  . Pain   . Pruritus   . Renal osteodystrophy   . Secondary hyperparathyroidism of renal origin ([place])   . Shortness of breath   . Type 2 diabetes mellitus with hypoglycemia without coma ([place])   . Unspecified protein-calorie malnutrition ([place])     Current Outpatient Medications:  .  aspirin EC 81 MG tablet, Take 81 mg by mouth daily., Disp: , Rfl:  .  B Complex-C-Folic Acid (DIALYVITE 810) 0.8 MG TABS, Take by mouth., Disp: , Rfl:  .  calcium acetate (PHOSLO) 667 MG capsule, Take 2,001 mg by mouth 3 (three) times daily with meals., Disp: , Rfl:  .  cinacalcet (SENSIPAR) 60 MG tablet, Take by mouth., Disp: , Rfl:  .  clopidogrel (PLAVIX) 75 MG tablet, Take 1 tablet (75 mg total) by mouth daily., Disp: 30 tablet, Rfl: 11 .  Cyanocobalamin (B-12) 500 MCG TABS, Take 500 mcg  by mouth 3 (three) times daily. , Disp: , Rfl:  .  FLUoxetine (PROZAC) 20 MG capsule, Take 20 mg by mouth daily., Disp: , Rfl:  .  furosemide (LASIX) 40 MG tablet, Take 40 mg by mouth daily. , Disp: , Rfl:  .  insulin aspart (NOVOLOG FLEXPEN) 100 UNIT/ML FlexPen, Inject 7-15 Units into the skin 3 (three) times daily with meals. Per sliding scale, Disp: , Rfl:  .  insulin glargine (LANTUS) 100 UNIT/ML injection, Inject 4 Units into the skin 2 (two) times daily. , Disp: , Rfl:  .  simvastatin (ZOCOR) 80 MG tablet, Take 80 mg by mouth daily., Disp: , Rfl:   Social History   Tobacco Use  Smoking Status Former Smoker  Smokeless Tobacco Never Used    Allergies  Allergen Reactions  . Promethazine Other (See Comments)    Tremors, cold, restlessness.  . Povidone-Iodine Rash   Objective:   Vitals:   06/09/18 0901  BP: 128/63  Pulse: 74  Resp: 16  Temp: 98 F (36.7 C)   There is no height or weight on file to calculate BMI. Constitutional Well developed. Well nourished.  Vascular Dorsalis pedis pulses palpable bilaterally. Posterior tibial pulses palpable bilaterally. Capillary refill normal to all digits.  No cyanosis or clubbing noted. Pedal hair growth normal.  Neurologic Normal speech. Oriented to person, place, and time. Protective sensation absent  Dermatologic Wound Location: R 1st MPJ Wound Base: Granular/Healthy Peri-wound: Calloused, macerated Exudate: Scant/small amount Serosanguinous exudate Wound Measurements: -04/13/18: 4.5x3 post-debridement -04/28/18: 2.5x2.5 post-debridement -05/11/18: 2x1.5 post-debridement -05/26/18: 2x1.5 post-debridement -06/09/18: 1.5x1 post-debridement   Orthopedic: No pain to palpation either foot.   Radiographs: None today Assessment:   No diagnosis found. Plan:  Patient was evaluated and treated and all questions answered.  Ulcer R 1st MPJ -Debridement as below. -Dressed with medihoney, DSD. -Continue off-loading with surgical  shoe.  Procedure: Excisional Debridement of Wound Rationale: Removal of non-viable soft tissue from the wound to promote healing.  Anesthesia: none Pre-Debridement Wound Measurements: 1 cm x 1 cm x 0.2 cm  Post-Debridement Wound Measurements: 1.5 cm x 1 cm x 0.2 cm  Type of Debridement: Sharp Excisional Tissue Removed: Non-viable soft tissue Depth of Debridement: subcutaneous tissue. Technique: Sharp excisional debridement to bleeding, viable wound base.  Dressing: Dry, sterile, compression dressing. Disposition: Patient tolerated procedure well. Patient to return in 1 week for follow-up.  Left ankle contusion with edema -Improving.  No follow-ups on file.

## 2018-06-15 ENCOUNTER — Ambulatory Visit: Payer: Medicare Other | Admitting: Podiatry

## 2018-06-29 ENCOUNTER — Ambulatory Visit (INDEPENDENT_AMBULATORY_CARE_PROVIDER_SITE_OTHER): Payer: Medicare Other | Admitting: Podiatry

## 2018-06-29 ENCOUNTER — Encounter: Payer: Self-pay | Admitting: Podiatry

## 2018-06-29 VITALS — BP 99/47 | HR 66 | Temp 96.1°F | Resp 16

## 2018-06-29 DIAGNOSIS — L97511 Non-pressure chronic ulcer of other part of right foot limited to breakdown of skin: Secondary | ICD-10-CM

## 2018-06-29 DIAGNOSIS — E08621 Diabetes mellitus due to underlying condition with foot ulcer: Secondary | ICD-10-CM | POA: Diagnosis not present

## 2018-06-29 NOTE — Progress Notes (Signed)
Subjective:  Patient ID: John Ortiz, male    DOB: 02-12-1946,  MRN: [id number]  Chief Complaint  Patient presents with  . Foot Ulcer    F/U R ulcer Pt. stated," gettin smaller and it's dong well." Tx: imerin crea -w/ bloody drainage -pt denies N/V/F?Ch     72 y.o. male presents for wound care.  Missed last week appt was confused on day.  Review of Systems: Negative except as noted in the HPI. Denies N/V/F/Ch.  Past Medical History:  Diagnosis Date  . Anemia in CKD (chronic kidney disease)   . Aneurysm (HCC)    Left arm  . CHF (congestive heart failure) ([place])   . Chronic kidney disease, stage V ([place])   . Coagulation defect ([place])   . Diabetes mellitus type 2, uncomplicated ([place])   . Diarrhea   . Fluid overload   . Hypercalcemia   . Iron deficiency anemia, unspecified   . Non-healing ulcer of foot ([place])    Right foot  . Pain   . Pruritus   . Renal osteodystrophy   . Secondary hyperparathyroidism of renal origin ([place])   . Shortness of breath   . Type 2 diabetes mellitus with hypoglycemia without coma ([place])   . Unspecified protein-calorie malnutrition ([place])     Current Outpatient Medications:  .  aspirin EC 81 MG tablet, Take 81 mg by mouth daily., Disp: , Rfl:  .  B Complex-C-Folic Acid (DIALYVITE 998) 0.8 MG TABS, Take by mouth., Disp: , Rfl:  .  calcium acetate (PHOSLO) 667 MG capsule, Take 2,001 mg by mouth 3 (three) times daily with meals., Disp: , Rfl:  .  cinacalcet (SENSIPAR) 60 MG tablet, Take by mouth., Disp: , Rfl:  .  clopidogrel (PLAVIX) 75 MG tablet, Take 1 tablet (75 mg total) by mouth daily., Disp: 30 tablet, Rfl: 11 .  Cyanocobalamin (B-12) 500 MCG TABS, Take 500 mcg by mouth 3 (three) times daily. , Disp: , Rfl:  .  FLUoxetine (PROZAC) 20 MG capsule, Take 20 mg by mouth daily., Disp: , Rfl:  .  furosemide (LASIX) 40 MG tablet, Take 40 mg by mouth daily. , Disp: , Rfl:  .  insulin aspart (NOVOLOG FLEXPEN) 100 UNIT/ML FlexPen, Inject 7-15 Units into  the skin 3 (three) times daily with meals. Per sliding scale, Disp: , Rfl:  .  insulin glargine (LANTUS) 100 UNIT/ML injection, Inject 4 Units into the skin 2 (two) times daily. , Disp: , Rfl:  .  simvastatin (ZOCOR) 80 MG tablet, Take 80 mg by mouth daily., Disp: , Rfl:   Social History   Tobacco Use  Smoking Status Former Smoker  Smokeless Tobacco Never Used    Allergies  Allergen Reactions  . Promethazine Other (See Comments)    Tremors, cold, restlessness.  . Povidone-Iodine Rash   Objective:   Vitals:   06/29/18 1345  BP: (!) 99/47  Pulse: 66  Resp: 16  Temp: (!) 96.1 F (35.6 C)   There is no height or weight on file to calculate BMI. Constitutional Well developed. Well nourished.  Vascular Dorsalis pedis pulses palpable bilaterally. Posterior tibial pulses palpable bilaterally. Capillary refill normal to all digits.  No cyanosis or clubbing noted. Pedal hair growth normal.  Neurologic Normal speech. Oriented to person, place, and time. Protective sensation absent  Dermatologic Wound Location: R 1st MPJ Wound Base: Granular/Healthy Peri-wound: Calloused, macerated Exudate: Scant/small amount Serosanguinous exudate Wound Measurements: -04/13/18: 4.5x3 post-debridement -04/28/18: 2.5x2.5 post-debridement -05/11/18: 2x1.5 post-debridement -05/26/18: 2x1.5  post-debridement -06/09/18: 1.5x1 post-debridement -06/29/18: 2x2 post-debridement  Orthopedic: No pain to palpation either foot.   Radiographs: None today Assessment:   No diagnosis found. Plan:  Patient was evaluated and treated and all questions answered.  Ulcer R 1st MPJ -Debridement as below. -Dressed with medihoney, DSD. -Continue off-loading with surgical shoe.  Procedure: Excisional Debridement of Wound Rationale: Removal of non-viable soft tissue from the wound to promote healing.  Anesthesia: none Pre-Debridement Wound Measurements: 1 cm x 1 cm x 0.2 cm  Post-Debridement Wound Measurements: 2  cm x 2 cm x 0.2 cm  Type of Debridement: Sharp Excisional Tissue Removed: Non-viable soft tissue Depth of Debridement: subcutaneous tissue. Technique: Sharp excisional debridement to bleeding, viable wound base.  Dressing: Dry, sterile, compression dressing. Disposition: Patient tolerated procedure well. Patient to return in 1 week for follow-up.   Return in about 1 week (around 07/06/2018) for Wound Care.

## 2018-06-30 ENCOUNTER — Other Ambulatory Visit: Payer: Self-pay

## 2018-06-30 ENCOUNTER — Encounter: Payer: Self-pay | Admitting: Vascular Surgery

## 2018-06-30 ENCOUNTER — Ambulatory Visit (INDEPENDENT_AMBULATORY_CARE_PROVIDER_SITE_OTHER): Payer: Medicare Other | Admitting: Vascular Surgery

## 2018-06-30 VITALS — BP 90/61 | HR 77 | Temp 97.5°F | Resp 18 | Ht 68.0 in | Wt 174.5 lb

## 2018-06-30 DIAGNOSIS — I739 Peripheral vascular disease, unspecified: Secondary | ICD-10-CM

## 2018-06-30 NOTE — Progress Notes (Signed)
Patient name: John Ortiz MRN: [id number] DOB: [birth year] Sex: male  REASON FOR VISIT: Follow-up status post right lower extremity angiogram with AT angioplasty  HPI: John Ortiz is a 72 y.o. male with history of end-stage renal disease, diabetes, CHF that presents for follow-up after right lower extremity angiogram with angioplasty of his anterior tibial artery.  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 Focal AT stenosis treated during angioplasty. ABI performed one month ago with triphasic waveforms at right ankle and TP 80+.  Now has a PCP and getting blood sugar controlled.  Also seeing podiatrist in [place] every couple of weeks for local debridement.  No new complaints.  No rest pain in right foot.  Feels ulcer is making progress.   Past Medical History:  Diagnosis Date  . Anemia in CKD (chronic kidney disease)   . Aneurysm (HCC)    Left arm  . CHF (congestive heart failure) ([place])   . Chronic kidney disease, stage V ([place])   . Coagulation defect ([place])   . Diabetes mellitus type 2, uncomplicated ([place])   . Diarrhea   . Fluid overload   . Hypercalcemia   . Iron deficiency anemia, unspecified   . Non-healing ulcer of foot ([place])    Right foot  . Pain   . Pruritus   . Renal osteodystrophy   . Secondary hyperparathyroidism of renal origin ([place])   . Shortness of breath   . Type 2 diabetes mellitus with hypoglycemia without coma ([place])   . Unspecified protein-calorie malnutrition ([place])     Past Surgical History:  Procedure Laterality Date  . ABDOMINAL AORTOGRAM W/LOWER EXTREMITY N/A 04/30/2018   Procedure: ABDOMINAL AORTOGRAM W/LOWER EXTREMITY;  Surgeon: Marty Heck, MD;  Location: [facility];  Service: Cardiovascular;  Laterality: N/A;  . av fistula transposed/ [place]    . PERIPHERAL VASCULAR BALLOON ANGIOPLASTY Right 04/30/2018   Procedure: PERIPHERAL VASCULAR BALLOON  ANGIOPLASTY;  Surgeon: Marty Heck, MD;  Location: [facility];  Service: Cardiovascular;  Laterality: Right;  Anterior tibial    History reviewed. No pertinent family history.  SOCIAL HISTORY: Social History   Tobacco Use  . Smoking status: Former Research scientist (life sciences)  . Smokeless tobacco: Never Used  Substance Use Topics  . Alcohol use: Never    Frequency: Never    Allergies  Allergen Reactions  . Promethazine Other (See Comments)    Tremors, cold, restlessness.  . Povidone-Iodine Rash    Current Outpatient Medications  Medication Sig Dispense Refill  . aspirin EC 81 MG tablet Take 81 mg by mouth daily.    . B Complex-C-Folic Acid (DIALYVITE 572) 0.8 MG TABS Take by mouth.    . calcium acetate (PHOSLO) 667 MG capsule Take 2,001 mg by mouth 3 (three) times daily with meals.    . cinacalcet (SENSIPAR) 60 MG tablet Take by mouth.    . clopidogrel (PLAVIX) 75 MG tablet Take 1 tablet (75 mg total) by mouth daily. 30 tablet 11  . Cyanocobalamin (B-12) 500 MCG TABS Take 500 mcg by mouth 3 (three) times daily.     Marland Kitchen FLUoxetine (PROZAC) 20 MG capsule Take 20 mg by mouth daily.    . furosemide (LASIX) 40 MG tablet Take 40 mg by mouth daily.     . insulin aspart (NOVOLOG FLEXPEN) 100 UNIT/ML FlexPen Inject 7-15 Units into the skin 3 (three) times daily with meals. Per sliding scale    .  insulin glargine (LANTUS) 100 UNIT/ML injection Inject 4 Units into the skin 2 (two) times daily.     . simvastatin (ZOCOR) 80 MG tablet Take 80 mg by mouth daily.     No current facility-administered medications for this visit.     REVIEW OF SYSTEMS:  [X]  denotes positive finding, [ ]  denotes negative finding Cardiac  Comments:  Chest pain or chest pressure:    Shortness of breath upon exertion:    Short of breath when lying flat:    Irregular heart rhythm:        Vascular    Pain in calf, thigh, or hip brought on by ambulation:    Pain in feet at night that wakes you up from your sleep:      Blood clot in your veins:    Leg swelling:         Pulmonary    Oxygen at home:    Productive cough:     Wheezing:         Neurologic    Sudden weakness in arms or legs:     Sudden numbness in arms or legs:     Sudden onset of difficulty speaking or slurred speech:    Temporary loss of vision in one eye:     Problems with dizziness:         Gastrointestinal    Blood in stool:     Vomited blood:         Genitourinary    Burning when urinating:     Blood in urine:        Psychiatric    Major depression:         Hematologic    Bleeding problems:    Problems with blood clotting too easily:        Skin    Rashes or ulcers:        Constitutional    Fever or chills:      PHYSICAL EXAM: Vitals:   06/30/18 0838  BP: 90/61  Pulse: 77  Resp: 18  Temp: (!) 97.5 F (36.4 C)  TempSrc: Oral  SpO2: 99%  Weight: 79.2 kg  Height: 5\' 8"  (1.727 m)    GENERAL: The patient is a well-nourished male, in no acute distress. The vital signs are documented above. VASCULAR:  Left groin healed after percutaneous access. Palpable femoral pulses bilaterally DP/PT brisk signals in right foot that appear triphasic with handheld doppler Clean based neuropathic ulcer bottom of right foot at metatarsal head - recently debrided and very clean. PULMONARY: There is good air exchange bilaterally without wheezing or rales. ABDOMEN: Soft and non-tender with normal pitched bowel sounds.  MUSCULOSKELETAL: There are no major deformities or cyanosis. NEUROLOGIC: No focal weakness or paresthesias are detected. SKIN: There are no ulcers or rashes noted.  DATA:   ABI from one month ago - triphasic waveforms at right ankle.  TP 84.   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  He has now undergone right lower extremity angiogram with intervention.  He actually had fairly decent runoff with patent tibial trifurcation and we treated  anterior tibial focal stenosis.  He has in line flow and has triphasic waveforms at ankle and adequate toe pressure of 84.  Close interval follow-up today and wound looks very clean.  I am hopeful he can heal this with ongoing wound care and blood sugar control.  Will see him back in 3 months  with ABI's since wound stable over last month with good flow into foot on exam today.  Will repeat ABI's in 3 months.  Discussed having him call office if wound worsens in interim.     Marty Heck, MD [facility] Office: [phone] Pager: [place]

## 2018-07-07 ENCOUNTER — Ambulatory Visit (INDEPENDENT_AMBULATORY_CARE_PROVIDER_SITE_OTHER): Payer: Medicare Other | Admitting: Podiatry

## 2018-07-07 ENCOUNTER — Encounter: Payer: Self-pay | Admitting: Podiatry

## 2018-07-07 VITALS — BP 84/49 | HR 72 | Temp 96.9°F

## 2018-07-07 DIAGNOSIS — L97511 Non-pressure chronic ulcer of other part of right foot limited to breakdown of skin: Secondary | ICD-10-CM | POA: Diagnosis not present

## 2018-07-07 DIAGNOSIS — E08621 Diabetes mellitus due to underlying condition with foot ulcer: Secondary | ICD-10-CM

## 2018-07-07 MED ORDER — SILVER SULFADIAZINE 1 % EX CREA: TOPICAL_CREAM | CUTANEOUS | 0 refills | Status: AC

## 2018-07-07 NOTE — Progress Notes (Signed)
Subjective:  Patient ID: John Ortiz, male    DOB: April 24, 1946,  MRN: [id number]  Chief Complaint  Patient presents with  . Foot Ulcer    F/U R 1st MPJ ulcer : Pt states," slowly healing, it looks about the same as last time, but it's better." Tx: imerin cream and bandaid -pt denies N/V/F?Ch -w/ same amount of bloody drainage -FBS; 236 -Pt. states," 1 wk ago had ultrasound on R leg and they said I have 80% bloody flow."    72 y.o. male presents for wound care. History above confirmed with patient.  Review of Systems: Negative except as noted in the HPI. Denies N/V/F/Ch.  Past Medical History:  Diagnosis Date  . Anemia in CKD (chronic kidney disease)   . Aneurysm (HCC)    Left arm  . CHF (congestive heart failure) ([place])   . Chronic kidney disease, stage V ([place])   . Coagulation defect ([place])   . Diabetes mellitus type 2, uncomplicated ([place])   . Diarrhea   . Fluid overload   . Hypercalcemia   . Iron deficiency anemia, unspecified   . Non-healing ulcer of foot ([place])    Right foot  . Pain   . Pruritus   . Renal osteodystrophy   . Secondary hyperparathyroidism of renal origin ([place])   . Shortness of breath   . Type 2 diabetes mellitus with hypoglycemia without coma ([place])   . Unspecified protein-calorie malnutrition ([place])     Current Outpatient Medications:  .  aspirin EC 81 MG tablet, Take 81 mg by mouth daily., Disp: , Rfl:  .  B Complex-C-Folic Acid (DIALYVITE 856) 0.8 MG TABS, Take by mouth., Disp: , Rfl:  .  calcium acetate (PHOSLO) 667 MG capsule, Take 2,001 mg by mouth 3 (three) times daily with meals., Disp: , Rfl:  .  cinacalcet (SENSIPAR) 60 MG tablet, Take by mouth., Disp: , Rfl:  .  clopidogrel (PLAVIX) 75 MG tablet, Take 1 tablet (75 mg total) by mouth daily., Disp: 30 tablet, Rfl: 11 .  Cyanocobalamin (B-12) 500 MCG TABS, Take 500 mcg by mouth 3 (three) times daily. , Disp: , Rfl:  .  FLUoxetine (PROZAC) 20 MG capsule, Take 20 mg by mouth daily., Disp: , Rfl:    .  furosemide (LASIX) 40 MG tablet, Take 40 mg by mouth daily. , Disp: , Rfl:  .  insulin aspart (NOVOLOG FLEXPEN) 100 UNIT/ML FlexPen, Inject 7-15 Units into the skin 3 (three) times daily with meals. Per sliding scale, Disp: , Rfl:  .  insulin glargine (LANTUS) 100 UNIT/ML injection, Inject 4 Units into the skin 2 (two) times daily. , Disp: , Rfl:  .  simvastatin (ZOCOR) 80 MG tablet, Take 80 mg by mouth daily., Disp: , Rfl:  .  silver sulfADIAZINE (SILVADENE) 1 % cream, Apply pea-sized amount to wound daily., Disp: 50 g, Rfl: 0  Social History   Tobacco Use  Smoking Status Former Smoker  Smokeless Tobacco Never Used    Allergies  Allergen Reactions  . Promethazine Other (See Comments)    Tremors, cold, restlessness.  . Povidone-Iodine Rash   Objective:   Vitals:   07/07/18 0932  BP: (!) 84/49  Pulse: 72  Temp: (!) 96.9 F (36.1 C)   There is no height or weight on file to calculate BMI. Constitutional Well developed. Well nourished.  Vascular Dorsalis pedis pulses palpable bilaterally. Posterior tibial pulses palpable bilaterally. Capillary refill normal to all digits.  No cyanosis or clubbing noted. Pedal  hair growth normal.  Neurologic Normal speech. Oriented to person, place, and time. Protective sensation absent  Dermatologic Wound Location: R 1st MPJ Wound Base: Granular/Healthy Peri-wound: Calloused, macerated Exudate: Scant/small amount Serosanguinous exudate Wound Measurements: -04/13/18: 4.5x3 post-debridement -04/28/18: 2.5x2.5 post-debridement -05/11/18: 2x1.5 post-debridement -05/26/18: 2x1.5 post-debridement -06/09/18: 1.5x1 post-debridement -06/29/18: 2x2 post-debridement -11/12-19 1.5x1.5 post-debridement  Orthopedic: No pain to palpation either foot.   Radiographs: None today Assessment:   1. Diabetic ulcer of other part of right foot associated with diabetes mellitus due to underlying condition, limited to breakdown of skin [facility])    Plan:   Patient was evaluated and treated and all questions answered.  Ulcer R 1st MPJ -Debridement as below. -Dressed with medihoney, DSD. -Continue off-loading with surgical shoe.  Procedure: Excisional Debridement of Wound Rationale: Removal of non-viable soft tissue from the wound to promote healing.  Anesthesia: none Pre-Debridement Wound Measurements: 1 cm x 1 cm x 0.2 cm  Post-Debridement Wound Measurements: 1.5 cm x 1.5 cm x 0.2 cm  Type of Debridement: Sharp Excisional Tissue Removed: Non-viable soft tissue Depth of Debridement: subcutaneous tissue. Technique: Sharp excisional debridement to bleeding, viable wound base.  Dressing: Dry, sterile, compression dressing. Disposition: Patient tolerated procedure well. Patient to return in 1 week for follow-up.  Return in about 1 week (around 07/14/2018) for Wound Care, Right.

## 2018-07-09 DIAGNOSIS — L97519 Non-pressure chronic ulcer of other part of right foot with unspecified severity: Secondary | ICD-10-CM

## 2018-07-09 DIAGNOSIS — E08621 Diabetes mellitus due to underlying condition with foot ulcer: Secondary | ICD-10-CM | POA: Insufficient documentation

## 2018-07-09 DIAGNOSIS — Z09 Encounter for follow-up examination after completed treatment for conditions other than malignant neoplasm: Secondary | ICD-10-CM | POA: Insufficient documentation

## 2018-07-14 ENCOUNTER — Ambulatory Visit (INDEPENDENT_AMBULATORY_CARE_PROVIDER_SITE_OTHER): Payer: Medicare Other | Admitting: Podiatry

## 2018-07-14 ENCOUNTER — Encounter: Payer: Self-pay | Admitting: Podiatry

## 2018-07-14 VITALS — BP 170/62 | HR 80 | Temp 96.2°F | Resp 16

## 2018-07-14 DIAGNOSIS — L97511 Non-pressure chronic ulcer of other part of right foot limited to breakdown of skin: Secondary | ICD-10-CM

## 2018-07-14 DIAGNOSIS — E08621 Diabetes mellitus due to underlying condition with foot ulcer: Secondary | ICD-10-CM | POA: Diagnosis not present

## 2018-07-14 NOTE — Progress Notes (Signed)
Subjective:  Patient ID: John Ortiz, male    DOB: 03-09-1946,  MRN: [id number]  Chief Complaint  Patient presents with  . Foot Ulcer    F/U R 1st MPJ ulcer PT. states," it feels good, it don't hurt any more, it feels smaller." Tx: imerin cream, and bandaid -w/ same amount of bloody drainage -pt deneis N/V/F/Ch   72 y.o. male presents for wound care. History above confirmed with patient.  Review of Systems: Negative except as noted in the HPI. Denies N/V/F/Ch.  Past Medical History:  Diagnosis Date  . Anemia in CKD (chronic kidney disease)   . Aneurysm (HCC)    Left arm  . CHF (congestive heart failure) ([place])   . Chronic kidney disease, stage V ([place])   . Coagulation defect ([place])   . Diabetes mellitus type 2, uncomplicated ([place])   . Diarrhea   . Fluid overload   . Hypercalcemia   . Iron deficiency anemia, unspecified   . Non-healing ulcer of foot ([place])    Right foot  . Pain   . Pruritus   . Renal osteodystrophy   . Secondary hyperparathyroidism of renal origin ([place])   . Shortness of breath   . Type 2 diabetes mellitus with hypoglycemia without coma ([place])   . Unspecified protein-calorie malnutrition ([place])     Current Outpatient Medications:  .  aspirin EC 81 MG tablet, Take 81 mg by mouth daily., Disp: , Rfl:  .  B Complex-C-Folic Acid (DIALYVITE 355) 0.8 MG TABS, Take by mouth., Disp: , Rfl:  .  calcium acetate (PHOSLO) 667 MG capsule, Take 2,001 mg by mouth 3 (three) times daily with meals., Disp: , Rfl:  .  cinacalcet (SENSIPAR) 60 MG tablet, Take by mouth., Disp: , Rfl:  .  clopidogrel (PLAVIX) 75 MG tablet, Take 1 tablet (75 mg total) by mouth daily., Disp: 30 tablet, Rfl: 11 .  Cyanocobalamin (B-12) 500 MCG TABS, Take 500 mcg by mouth 3 (three) times daily. , Disp: , Rfl:  .  FLUoxetine (PROZAC) 20 MG capsule, Take 20 mg by mouth daily., Disp: , Rfl:  .  furosemide (LASIX) 40 MG tablet, Take 40 mg by mouth daily. , Disp: , Rfl:  .  insulin aspart (NOVOLOG  FLEXPEN) 100 UNIT/ML FlexPen, Inject 7-15 Units into the skin 3 (three) times daily with meals. Per sliding scale, Disp: , Rfl:  .  insulin glargine (LANTUS) 100 UNIT/ML injection, Inject 4 Units into the skin 2 (two) times daily. , Disp: , Rfl:  .  silver sulfADIAZINE (SILVADENE) 1 % cream, Apply pea-sized amount to wound daily., Disp: 50 g, Rfl: 0 .  simvastatin (ZOCOR) 80 MG tablet, Take 80 mg by mouth daily., Disp: , Rfl:   Social History   Tobacco Use  Smoking Status Former Smoker  Smokeless Tobacco Never Used    Allergies  Allergen Reactions  . Promethazine Other (See Comments)    Tremors, cold, restlessness.  . Povidone-Iodine Rash   Objective:   Vitals:   07/14/18 1055  BP: (!) 170/62  Pulse: 80  Resp: 16  Temp: (!) 96.2 F (35.7 C)   There is no height or weight on file to calculate BMI. Constitutional Well developed. Well nourished.  Vascular Dorsalis pedis pulses palpable bilaterally. Posterior tibial pulses palpable bilaterally. Capillary refill normal to all digits.  No cyanosis or clubbing noted. Pedal hair growth normal.  Neurologic Normal speech. Oriented to person, place, and time. Protective sensation absent  Dermatologic Wound Location: R 1st  MPJ Wound Base: Granular/Healthy Peri-wound: Calloused, macerated Exudate: Scant/small amount Serosanguinous exudate Wound Measurements: -04/13/18: 4.5x3 post-debridement -04/28/18: 2.5x2.5 post-debridement -05/11/18: 2x1.5 post-debridement -05/26/18: 2x1.5 post-debridement -06/09/18: 1.5x1 post-debridement -06/29/18: 2x2 post-debridement -11/12-19 1.5x1.5 post-debridement -11/19: 1x1 post-debridement  Orthopedic: No pain to palpation either foot.   Radiographs: None today Assessment:   1. 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  Ulcer R 1st MPJ -Debridement as below. -Dressed with medihoney, DSD. -Continue off-loading with surgical shoe.  Procedure: Excisional Debridement of Wound Rationale: Removal of non-viable soft tissue from the wound to promote healing.  Anesthesia: none Pre-Debridement Wound Measurements: 1 cm x 0.5 cm  Post-Debridement Wound Measurements: 1 cm x 1 cm   Type of Debridement: Sharp Excisional Tissue Removed: Non-viable soft tissue Depth of Debridement: subcutaneous tissue. Technique: Sharp excisional debridement to bleeding, viable wound base.  Dressing: Dry, sterile, compression dressing. Disposition: Patient tolerated procedure well. Patient to return in 1 week for follow-up.   No follow-ups on file.

## 2018-07-21 ENCOUNTER — Encounter: Payer: Self-pay | Admitting: Podiatry

## 2018-07-21 ENCOUNTER — Ambulatory Visit (INDEPENDENT_AMBULATORY_CARE_PROVIDER_SITE_OTHER): Payer: No Typology Code available for payment source | Admitting: Cardiology

## 2018-07-21 ENCOUNTER — Ambulatory Visit (INDEPENDENT_AMBULATORY_CARE_PROVIDER_SITE_OTHER): Payer: Medicare Other | Admitting: Podiatry

## 2018-07-21 VITALS — BP 108/58 | HR 99 | Temp 97.0°F | Resp 16

## 2018-07-21 VITALS — BP 118/74 | HR 77 | Ht 68.0 in | Wt 177.0 lb

## 2018-07-21 DIAGNOSIS — Z7189 Other specified counseling: Secondary | ICD-10-CM

## 2018-07-21 DIAGNOSIS — L97511 Non-pressure chronic ulcer of other part of right foot limited to breakdown of skin: Secondary | ICD-10-CM

## 2018-07-21 DIAGNOSIS — Z952 Presence of prosthetic heart valve: Secondary | ICD-10-CM

## 2018-07-21 DIAGNOSIS — I739 Peripheral vascular disease, unspecified: Secondary | ICD-10-CM

## 2018-07-21 DIAGNOSIS — I25810 Atherosclerosis of coronary artery bypass graft(s) without angina pectoris: Secondary | ICD-10-CM

## 2018-07-21 DIAGNOSIS — I480 Paroxysmal atrial fibrillation: Secondary | ICD-10-CM

## 2018-07-21 DIAGNOSIS — Z951 Presence of aortocoronary bypass graft: Secondary | ICD-10-CM

## 2018-07-21 DIAGNOSIS — Z79899 Other long term (current) drug therapy: Secondary | ICD-10-CM

## 2018-07-21 DIAGNOSIS — E08621 Diabetes mellitus due to underlying condition with foot ulcer: Secondary | ICD-10-CM | POA: Diagnosis not present

## 2018-07-21 MED ORDER — ATORVASTATIN CALCIUM 40 MG PO TABS: 40.0000 mg | ORAL_TABLET | Freq: Every day | ORAL | 3 refills | Status: DC

## 2018-07-21 NOTE — Progress Notes (Signed)
Cardiology Office Note:    Date:  07/21/2018   ID:  John Ortiz, DOB 12-16-1945, MRN [id number]  PCP:  [facility], [facility]  Cardiologist:  Buford Dresser, MD PhD  Referring MD: [facility], [place]   CC. Follow up  History of Present Illness:    John Ortiz is a 72 y.o. male with a hx of ESRD on hemodialysis, type II diabetes, CAD s/p CABG, moderate aortic stenosis who is seen in follow up today. He was initially seen in consult on 04/21/18 for moderate aortic stenosis.  Cardiac history:  He lived in [place] for many years and recently moved to [place], [place]. He has a history of 3V CABG done in 2008 at [facility] with Dr. Geraldo Docker. Also noted to have prior bicuspid aortic valve, now s/p AVR 08/2014 with redo sternotomy at [facility] (post op course complicated by groin abscess). Last cath 01/2016 after abnormal stress test. He received a DES to OM1. His LIMA was patent, but the two prior SVG conduits were documented as previously known to be occluded. He was deemed to not be a candidate for kidney transplant by [facility].  On initial visit 03/2018, found to be in afib (thought to be new per patient, but previous seen during a hospitalization on 02/08/16, was cardioverted. He had similar postoperative atrial fibrillation in 09/2014 after his AVR, also requiring cardioversion. He was not on anticoagulation previously.  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 Being followed by podiatry for debridement and treatment of wound.  Concerns today: CAD: no chest pain or shortness of breath.  PAD: leg feels much better post angioplasty, foot healing well ESRD on dialysis: thinks he needs graft revision of the LUE given that it has expanded. Tolerating dialysis well.  Diabetes: highly variable. Has had lows as low as 32 at night, can be as high as 400-500 during  the day. I do not have a recent A1c available to me. His diabetes is managed by [facility], [facility]uson and is followed closely with recent adjustments.  Past Medical History:  Diagnosis Date  . Anemia in CKD (chronic kidney disease)   . Aneurysm (HCC)    Left arm  . CHF (congestive heart failure) ([place])   . Chronic kidney disease, stage V ([place])   . Coagulation defect ([place])   . Diabetes mellitus type 2, uncomplicated ([place])   . Diarrhea   . Fluid overload   . Hypercalcemia   . Iron deficiency anemia, unspecified   . Non-healing ulcer of foot ([place])    Right foot  . Pain   . Pruritus   . Renal osteodystrophy   . Secondary hyperparathyroidism of renal origin ([place])   . Shortness of breath   . Type 2 diabetes mellitus with hypoglycemia without coma ([place])   . Unspecified protein-calorie malnutrition ([place])     Past Surgical History:  Procedure Laterality Date  . ABDOMINAL AORTOGRAM W/LOWER EXTREMITY N/A 04/30/2018   Procedure: ABDOMINAL AORTOGRAM W/LOWER EXTREMITY;  Surgeon: Marty Heck, MD;  Location: [facility];  Service: Cardiovascular;  Laterality: N/A;  . av fistula transposed/ [place]    . PERIPHERAL VASCULAR BALLOON ANGIOPLASTY Right 04/30/2018   Procedure: PERIPHERAL VASCULAR BALLOON ANGIOPLASTY;  Surgeon: Marty Heck, MD;  Location: [facility] [facility];  Service: Cardiovascular;  Laterality: Right;  Anterior tibial    Current Medications: Current Outpatient Medications on File  Prior to Visit  Medication Sig  . aspirin EC 81 MG tablet Take 81 mg by mouth daily.  . B Complex-C-Folic Acid (DIALYVITE 166) 0.8 MG TABS Take by mouth.  . calcium acetate (PHOSLO) 667 MG capsule Take 2,001 mg by mouth 3 (three) times daily with meals.  . cinacalcet (SENSIPAR) 60 MG tablet Take by mouth.  . clopidogrel (PLAVIX) 75 MG tablet Take 1 tablet (75 mg total) by mouth daily.  . Cyanocobalamin (B-12) 500 MCG TABS Take 500 mcg by mouth 3 (three) times daily.   Marland Kitchen  FLUoxetine (PROZAC) 20 MG capsule Take 20 mg by mouth daily.  . furosemide (LASIX) 40 MG tablet Take 40 mg by mouth daily.   . insulin aspart (NOVOLOG FLEXPEN) 100 UNIT/ML FlexPen Inject 7-15 Units into the skin 3 (three) times daily with meals. Per sliding scale  . insulin glargine (LANTUS) 100 UNIT/ML injection Inject 4 Units into the skin 2 (two) times daily.   . silver sulfADIAZINE (SILVADENE) 1 % cream Apply pea-sized amount to wound daily.   No current facility-administered medications on file prior to visit.      Allergies:   Promethazine and Povidone-iodine   Social History   Socioeconomic History  . Marital status: Married    Spouse name: Not on file  . Number of children: Not on file  . Years of education: Not on file  . Highest education level: Not on file  Occupational History  . Not on file  Social Needs  . Financial resource strain: Not on file  . Food insecurity:    Worry: Not on file    Inability: Not on file  . Transportation needs:    Medical: Not on file    Non-medical: Not on file  Tobacco Use  . Smoking status: Former Research scientist (life sciences)  . Smokeless tobacco: Never Used  Substance and Sexual Activity  . Alcohol use: Never    Frequency: Never  . Drug use: Never  . Sexual activity: Not on file  Lifestyle  . Physical activity:    Days per week: Not on file    Minutes per session: Not on file  . Stress: Not on file  Relationships  . Social connections:    Talks on phone: Not on file    Gets together: Not on file    Attends religious service: Not on file    Active member of club or organization: Not on file    Attends meetings of clubs or organizations: Not on file    Relationship status: Not on file  Other Topics Concern  . Not on file  Social History Narrative  . Not on file   Married, former smoker (1 ppd for 20 years, quit in 1990). Drinks 2 beers/year on the super bowl.  Family History: The patient's FH is notable for CAD, asthma in his mother,  diabetes in his father, COPD in his brother  ROS:   Please see the history of present illness.  Additional pertinent ROS: Review of Systems  Constitutional: Negative for chills, fever and weight loss.  HENT: Negative for ear pain and hearing loss.   Eyes: Negative for blurred vision and pain.  Respiratory: Negative for hemoptysis and shortness of breath.   Cardiovascular: Positive for claudication. Negative for chest pain, palpitations, orthopnea, leg swelling and PND.  Gastrointestinal: Negative for abdominal pain, blood in stool and melena.  Genitourinary: Negative for flank pain and hematuria.  Musculoskeletal: Positive for joint pain. Negative for falls.  Skin: Negative for  rash.  Neurological: Negative for focal weakness and loss of consciousness.  Endo/Heme/Allergies: Bruises/bleeds easily.   EKGs/Labs/Other Studies Reviewed:    The following studies were reviewed today: Echo from 03/03/18 [facility]) SUMMARY The left ventricular size is normal.  Left ventricular systolic function is low normal. LV ejection fraction = 50-55%. The left ventricular wall motion is normal.  Left ventricular filling pattern is indeterminate. The right ventricle is normal in size and function. The left atrium is mildly dilated. Diffuse calcification of the aortic valve. There is moderate aortic stenosis. Aortic valve mean pressure gradient is 28 mmHg. The peak aortic valve velocity 354 cm/s. There is mild mitral regurgitation. IVC size was mildly dilated. There is no pericardial effusion. There is no comparison study available. R/LHC for abnormal stress test on 01/31/2016 [facility] 02/08/16 (Dr. Percell Miller) FINDINGS: HEMODYNAMICS: Heart rate 63. Aortic pressure 165/64. CORONARY ANATOMY: The left coronary: Left main trunk; angiographically normal.  Left anterior descending coronary artery: This is a large-caliber vessel that wraps around the apex. It is subtotally occluded after the  takeoff of a large septal perforator. The LAD is reconstituted distally by flow from a patent left internal mammary artery graft.  Left circumflex: This is a large-caliber dominant system that gives rise to a large lateral branch and a large posterolateral branch. There is an eccentric plaque of about 50% in the AV circumflex just prior to the takeoff of the lateral branch. In the proximal portion of the lateral branch, there is some tubular narrowing in the 50-60% range.  Right coronary artery: This is a small-caliber nondominant vessel that contains some tubular narrowing of 50% in its proximal portion.  Saphenous vein grafts x2 were not selectively injected and were known to be occluded.  Left internal mammary artery graft to LAD could not be selectively engaged; however, nonselectively appears widely patent with distal filling of the LAD. DIAGNOSES: 1.Significant left anterior descending coronary artery stenosis, with widely patent left internal mammary artery graft to the left anterior descending.  2.Borderline obstructive disease in the dominant left circumflex distribution, with angiographic obstruction of 50% and 50-60% respectively. COMMENTS: Angiographically, the patient appears to be a candidate for fractional flow reserve assessment of the left circumflex borderline obstructive lesions. If these are not flow-limiting, consideration will be given to right heart catheterization to assess volume status. The patient is dialysis dependent.  FFR and PCI/DES (Promus) to OM1 02/08/16 (Dr. Jenean Lindau) Fractional flow reserve data:  Fractional flow reserve measurements were obtained using IV adenosine per protocol. Coronary vesselFFR First marginal of left circumflex0.78 Equipment Used: Guide: 33F EBU 3.75 Steering Wire: Doppler flow wire Balloon(s): 3.5/15 [place] Euphora Stent(s): 4.0/20 Promus Premier stent Other equipment:  Guideliner Results: Lesion locationPre-PCI % stenosisPost-PCI % stenosis First marginal branch75%0% Conclusions: Successful drug-eluting stent placement to the first marginal branch artery of the left circumflex artery.   EKG:  EKG is ordered today.  The ekg ordered today demonstrates atrial fibrillation with ventricular rate of 77 bpm, LBBB.  Recent Labs: 04/30/2018: BUN 45; Creatinine, Ser 5.30; Hemoglobin 12.9; Potassium 5.0; Sodium 128  Recent Lipid Panel No results found for: CHOL, TRIG, HDL, CHOLHDL, VLDL, LDLCALC, LDLDIRECT  Physical Exam:    VS:  BP 118/74   Pulse 77   Ht 5\' 8"  (1.727 m)   Wt 177 lb (80.3 kg)   SpO2 98%   BMI 26.91 kg/m     Wt Readings from Last 3 Encounters:  07/21/18 177 lb (80.3 kg)  06/30/18 174 lb 8 oz (  79.2 kg)  06/02/18 171 lb (77.6 kg)     GEN: Thin but well appearing gentleman, very pleasant and verbose, fair historian with details HEENT: Normal NECK: No JVD; No thyromegaly LYMPHATICS: No lymphadenopathy CARDIAC: irregularly irregular rhythm, normal S1 and S2, 3/6 harsh SEM, early peaking, at RUSB. no rubs, gallops. Radial pulses 2+ bilaterally. Lower extremities warm with faint pulses bilaterally. RESPIRATORY:  Clear to auscultation without rales, wheezing or rhonchi  ABDOMEN: Soft, non-tender, non-distended MUSCULOSKELETAL:  Trace bilateral LE edema. Left arm fistula with bruit and thrill SKIN: Warm and dry NEUROLOGIC:  Ambulates independently with walking stick.  PSYCHIATRIC:  Not pressured in speech but mildly tangential, repeated a story twice.  ASSESSMENT:    1. PAD (peripheral artery disease) ([place])   2. Coronary artery disease involving coronary bypass graft of native heart without angina pectoris   3. Hx of CABG   4. S/P AVR (aortic valve replacement)   5. Medication management   6. Paroxysmal atrial fibrillation (HCC)     7. Counseling on health promotion and disease prevention    PLAN:    1. Atrial fibrillation CHA2DS2/VAS Stroke Risk Points  =  4  Points Metrics  0 Has Congestive Heart Failure:  No   1 Has Vascular Disease:  Yes   1 Has Hypertension:  Yes    1 Age:  60   1 Has Diabetes:  Yes   0 Had Stroke:  No  Had TIA:  No  Had thromboembolism:  No   0 Male:  No    We again discussed anticoagulation at length (see prior note as well). He is not interested in anticoagulation at this time. We did discuss the risk of stroke, and he wishes to defer on anticoagulation. He is high risk for CVA.   2. CAD s/p CABG (with two SVG vein grafts known down, stent to OM1): no active chest pain, no chest pain on dialysis.  -continue aspirin and clopidogrel  -changed simvastatin to atorvastatin today. I printed him out a prescription to take to the [place] and instructed him to call me if he has any issues filling it.  3. Aortic stenosis s/p AVR: moderate AS on echo from [facility].  4. PAD: had angiogram/angioplasty in [place], reports good wound healing.    5. ESRD on dialysis: [facility]. Looking into fistula revision through his dialysis center.  6. Secondary prevention: -recommend heart healthy/Mediterranean diet, with whole grains, fruits, vegetable, fish, lean meats, nuts, and olive oil. Limit salt. -recommend moderate walking, 3-5 times/week for 30-50 minutes each session. Aim for at least 150 minutes.week. Goal should be pace of 3 miles/hours, or walking 1.5 miles in 30 minutes -recommend avoidance of tobacco products. Avoid excess alcohol. -ASCVD risk score: not accurate for secondary prevention, but shows his elevated risk The 10-year ASCVD risk score Mikey Bussing DC Brooke Bonito., et al., 2013) is: 25.1%   Values used to calculate the score:     Age: 50 years     Sex: Male     Is Non-Hispanic African American: No     Diabetic: Yes     Tobacco smoker: No     Systolic Blood Pressure: 563 mmHg     Is  BP treated: Yes     HDL Cholesterol: 74 MG/DL     Total Cholesterol: 162 MG/DL   Plan for follow up: 1 year or sooner PRN  TIME SPENT WITH PATIENT: >25 minutes of direct patient care. More than 50% of that time  was spent on coordination of care and counseling regarding prior CAD history, PAD, atrial fibrillation, and management of his risk of stroke or other complications.  Buford Dresser, MD, PhD   [facility]   Medication Adjustments/Labs and Tests Ordered: Current medicines are reviewed at length with the patient today.  Concerns regarding medicines are outlined above.  Orders Placed This Encounter  Procedures  . EKG 12-Lead   Meds ordered this encounter  Medications  . DISCONTD: atorvastatin (LIPITOR) 40 MG tablet    Sig: Take 1 tablet (40 mg total) by mouth daily.    Dispense:  90 tablet    Refill:  3  . atorvastatin (LIPITOR) 40 MG tablet    Sig: Take 1 tablet (40 mg total) by mouth daily.    Dispense:  90 tablet    Refill:  3    Patient Instructions  Medication Instructions:  Stop: Simvastatin 80 mg  Start: Atorvastatin 40 mg daily  If you need a refill on your cardiac medications before your next appointment, please call your pharmacy.   Lab work: None  Testing/Procedures: None  Follow-Up: At [facility], you and your health needs are our priority.  As part of our continuing mission to provide you with exceptional heart care, we have created designated Provider Care Teams.  These Care Teams include your primary Cardiologist (physician) and Advanced Practice Providers (APPs -  Physician Assistants and Nurse Practitioners) who all work together to provide you with the care you need, when you need it. You will need a follow up appointment in 1 years.  Please call our office 2 months in advance to schedule this appointment.  You may see Dr. Harrell Gave or one of the following Advanced Practice Providers on your designated Care Team:   Rosaria Ferries, PA-C . Jory Sims, DNP, ANP  Any Other Special Instructions Will Be Listed Below (If Applicable)      Signed, Buford Dresser, MD PhD 07/21/2018 4:34 PM    [place]

## 2018-07-21 NOTE — Patient Instructions (Signed)
Medication Instructions:  Stop: Simvastatin 80 mg  Start: Atorvastatin 40 mg daily  If you need a refill on your cardiac medications before your next appointment, please call your pharmacy.   Lab work: None  Testing/Procedures: None  Follow-Up: At [facility], you and your health needs are our priority.  As part of our continuing mission to provide you with exceptional heart care, we have created designated Provider Care Teams.  These Care Teams include your primary Cardiologist (physician) and Advanced Practice Providers (APPs -  Physician Assistants and Nurse Practitioners) who all work together to provide you with the care you need, when you need it. You will need a follow up appointment in 1 years.  Please call our office 2 months in advance to schedule this appointment.  You may see Dr. Harrell Gave or one of the following Advanced Practice Providers on your designated Care Team:   Rosaria Ferries, PA-C . Jory Sims, DNP, ANP  Any Other Special Instructions Will Be Listed Below (If Applicable)

## 2018-07-21 NOTE — Progress Notes (Signed)
Subjective:  Patient ID: John Ortiz, male    DOB: 1946-01-11,  MRN: [id number]  Chief Complaint  Patient presents with  . Foot Ulcer    F/U R 1st MPJ ulcer PT. states," it's doing good, it's not hurting me at all, and it seems like it's getting callused." Tx: imerin cream and bandaid -pt denies N/V/F?CH -w/ bloody drainage/ and pt have had chills since yesterday   72 y.o. male presents for wound care. History above confirmed with patient.  Review of Systems: Negative except as noted in the HPI. Denies N/V/F/Ch.  Past Medical History:  Diagnosis Date  . Anemia in CKD (chronic kidney disease)   . Aneurysm (HCC)    Left arm  . CHF (congestive heart failure) ([place])   . Chronic kidney disease, stage V ([place])   . Coagulation defect ([place])   . Diabetes mellitus type 2, uncomplicated ([place])   . Diarrhea   . Fluid overload   . Hypercalcemia   . Iron deficiency anemia, unspecified   . Non-healing ulcer of foot ([place])    Right foot  . Pain   . Pruritus   . Renal osteodystrophy   . Secondary hyperparathyroidism of renal origin ([place])   . Shortness of breath   . Type 2 diabetes mellitus with hypoglycemia without coma ([place])   . Unspecified protein-calorie malnutrition ([place])     Current Outpatient Medications:  .  aspirin EC 81 MG tablet, Take 81 mg by mouth daily., Disp: , Rfl:  .  B Complex-C-Folic Acid (DIALYVITE 324) 0.8 MG TABS, Take by mouth., Disp: , Rfl:  .  calcium acetate (PHOSLO) 667 MG capsule, Take 2,001 mg by mouth 3 (three) times daily with meals., Disp: , Rfl:  .  cinacalcet (SENSIPAR) 60 MG tablet, Take by mouth., Disp: , Rfl:  .  clopidogrel (PLAVIX) 75 MG tablet, Take 1 tablet (75 mg total) by mouth daily., Disp: 30 tablet, Rfl: 11 .  Cyanocobalamin (B-12) 500 MCG TABS, Take 500 mcg by mouth 3 (three) times daily. , Disp: , Rfl:  .  FLUoxetine (PROZAC) 20 MG capsule, Take 20 mg by mouth daily., Disp: , Rfl:  .  furosemide (LASIX) 40 MG tablet, Take 40 mg by mouth  daily. , Disp: , Rfl:  .  insulin aspart (NOVOLOG FLEXPEN) 100 UNIT/ML FlexPen, Inject 7-15 Units into the skin 3 (three) times daily with meals. Per sliding scale, Disp: , Rfl:  .  insulin glargine (LANTUS) 100 UNIT/ML injection, Inject 4 Units into the skin 2 (two) times daily. , Disp: , Rfl:  .  silver sulfADIAZINE (SILVADENE) 1 % cream, Apply pea-sized amount to wound daily., Disp: 50 g, Rfl: 0 .  simvastatin (ZOCOR) 80 MG tablet, Take 80 mg by mouth daily., Disp: , Rfl:   Social History   Tobacco Use  Smoking Status Former Smoker  Smokeless Tobacco Never Used    Allergies  Allergen Reactions  . Promethazine Other (See Comments)    Tremors, cold, restlessness.  . Povidone-Iodine Rash   Objective:   Vitals:   07/21/18 0900  BP: (!) 108/58  Pulse: 99  Resp: 16  Temp: (!) 97 F (36.1 C)   There is no height or weight on file to calculate BMI. Constitutional Well developed. Well nourished.  Vascular Dorsalis pedis pulses palpable bilaterally. Posterior tibial pulses palpable bilaterally. Capillary refill normal to all digits.  No cyanosis or clubbing noted. Pedal hair growth normal.  Neurologic Normal speech. Oriented to person, place, and time.  Protective sensation absent  Dermatologic Wound Location: R 1st MPJ Wound Base: Granular/Healthy Peri-wound: calloused. Exudate: moderate amount Serosanguinous exudate Wound Measurements: -04/13/18: 4.5x3 post-debridement -04/28/18: 2.5x2.5 post-debridement -05/11/18: 2x1.5 post-debridement -05/26/18: 2x1.5 post-debridement -06/09/18: 1.5x1 post-debridement -06/29/18: 2x2 post-debridement -11/12-19 1.5x1.5 post-debridement -11/19: 1x1 post-debridement -11/26: 1x1 post-debridement  Orthopedic: No pain to palpation either foot.   Radiographs: None today Assessment:   1. 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  Ulcer R 1st MPJ -Debridement as below. -Dressed with iodosorb, DSD. -Continue off-loading with surgical shoe. -Will place order for Prisma AG  Procedure: Excisional Debridement of Wound Rationale: Removal of non-viable soft tissue from the wound to promote healing.  Anesthesia: none Pre-Debridement Wound Measurements: 0.8 cm x 1 cm x 0.2 cm  Post-Debridement Wound Measurements: 1 cm x 1 cm x 0.2 cm  Type of Debridement: Sharp Excisional Tissue Removed: Non-viable soft tissue Depth of Debridement: subcutaneous tissue. Technique: Sharp excisional debridement to bleeding, viable wound base.  Dressing: Dry, sterile, compression dressing. Disposition: Patient tolerated procedure well. Patient to return in 1 week for follow-up.   Return in about 1 week (around 07/28/2018) for Wound Care.

## 2018-07-22 ENCOUNTER — Telehealth: Payer: Self-pay | Admitting: *Deleted

## 2018-07-22 NOTE — Telephone Encounter (Signed)
Dr. March Rummage ordered Prisma AG 2 x 2 to be applied daily to right 1st MPJ, from [place].

## 2018-07-22 NOTE — Telephone Encounter (Signed)
-----   Message from John Ortiz, DPM sent at 07/21/2018 10:21 AM EST ----- Can we order him some prisma Ag to be applied daily? I don't have any of the forms here in [place] for that [place] place.

## 2018-07-23 ENCOUNTER — Encounter: Payer: Self-pay | Admitting: Cardiology

## 2018-07-28 ENCOUNTER — Telehealth: Payer: Self-pay | Admitting: Podiatry

## 2018-07-28 ENCOUNTER — Encounter: Payer: Self-pay | Admitting: Podiatry

## 2018-07-28 ENCOUNTER — Ambulatory Visit (INDEPENDENT_AMBULATORY_CARE_PROVIDER_SITE_OTHER): Payer: Medicare Other | Admitting: Podiatry

## 2018-07-28 VITALS — BP 138/85 | HR 80 | Temp 98.1°F | Resp 16

## 2018-07-28 DIAGNOSIS — E08621 Diabetes mellitus due to underlying condition with foot ulcer: Secondary | ICD-10-CM

## 2018-07-28 DIAGNOSIS — L97511 Non-pressure chronic ulcer of other part of right foot limited to breakdown of skin: Secondary | ICD-10-CM

## 2018-07-28 NOTE — Progress Notes (Signed)
Subjective:  Patient ID: John Ortiz, male    DOB: February 06, 1946,  MRN: [id number]  Chief Complaint  Patient presents with  . Foot Ulcer    F/U R 1st MPJ ulcer pt. states," my R leg feels wramer than the L, I'm sure it's healin g and no pain." Tx: imerin cream and prisma -pt denies N/V/F/Ch -w/ bloody drainage -FBS: 51   72 y.o. male presents for wound care. History above confirmed with patient.  Review of Systems: Negative except as noted in the HPI. Denies N/V/F/Ch.  Past Medical History:  Diagnosis Date  . Anemia in CKD (chronic kidney disease)   . Aneurysm (HCC)    Left arm  . CHF (congestive heart failure) ([place])   . Chronic kidney disease, stage V ([place])   . Coagulation defect ([place])   . Diabetes mellitus type 2, uncomplicated ([place])   . Diarrhea   . Fluid overload   . Hypercalcemia   . Iron deficiency anemia, unspecified   . Non-healing ulcer of foot ([place])    Right foot  . Pain   . Pruritus   . Renal osteodystrophy   . Secondary hyperparathyroidism of renal origin ([place])   . Shortness of breath   . Type 2 diabetes mellitus with hypoglycemia without coma ([place])   . Unspecified protein-calorie malnutrition ([place])     Current Outpatient Medications:  .  aspirin EC 81 MG tablet, Take 81 mg by mouth daily., Disp: , Rfl:  .  atorvastatin (LIPITOR) 40 MG tablet, Take 1 tablet (40 mg total) by mouth daily., Disp: 90 tablet, Rfl: 3 .  B Complex-C-Folic Acid (DIALYVITE 284) 0.8 MG TABS, Take by mouth., Disp: , Rfl:  .  calcium acetate (PHOSLO) 667 MG capsule, Take 2,001 mg by mouth 3 (three) times daily with meals., Disp: , Rfl:  .  cinacalcet (SENSIPAR) 60 MG tablet, Take by mouth., Disp: , Rfl:  .  clopidogrel (PLAVIX) 75 MG tablet, Take 1 tablet (75 mg total) by mouth daily., Disp: 30 tablet, Rfl: 11 .  Cyanocobalamin (B-12) 500 MCG TABS, Take 500 mcg by mouth 3 (three) times daily. , Disp: , Rfl:  .  FLUoxetine (PROZAC) 20 MG capsule, Take 20 mg by mouth daily., Disp: ,  Rfl:  .  furosemide (LASIX) 40 MG tablet, Take 40 mg by mouth daily. , Disp: , Rfl:  .  insulin aspart (NOVOLOG FLEXPEN) 100 UNIT/ML FlexPen, Inject 7-15 Units into the skin 3 (three) times daily with meals. Per sliding scale, Disp: , Rfl:  .  insulin glargine (LANTUS) 100 UNIT/ML injection, Inject 4 Units into the skin 2 (two) times daily. , Disp: , Rfl:  .  silver sulfADIAZINE (SILVADENE) 1 % cream, Apply pea-sized amount to wound daily., Disp: 50 g, Rfl: 0  Social History   Tobacco Use  Smoking Status Former Smoker  Smokeless Tobacco Never Used    Allergies  Allergen Reactions  . Promethazine Other (See Comments)    Tremors, cold, restlessness.  . Povidone-Iodine Rash   Objective:   Vitals:   07/28/18 1004  BP: 138/85  Pulse: 80  Resp: 16  Temp: 98.1 F (36.7 C)   There is no height or weight on file to calculate BMI. Constitutional Well developed. Well nourished.  Vascular Dorsalis pedis pulses palpable bilaterally. Posterior tibial pulses palpable bilaterally. Capillary refill normal to all digits.  No cyanosis or clubbing noted. Pedal hair growth normal.  Neurologic Normal speech. Oriented to person, place, and time. Protective sensation absent  Dermatologic Wound Location: R 1st MPJ Wound Base: Granular/Healthy Peri-wound: calloused. Exudate: moderate amount Serosanguinous exudate Wound Measurements: -04/13/18: 4.5x3 post-debridement -04/28/18: 2.5x2.5 post-debridement -05/11/18: 2x1.5 post-debridement -05/26/18: 2x1.5 post-debridement -06/09/18: 1.5x1 post-debridement -06/29/18: 2x2 post-debridement -11/12-19 1.5x1.5 post-debridement -11/19: 1x1 post-debridement -11/26: 1x1 post-debridement -12/3: 1x1 post-debridement  Orthopedic: No pain to palpation either foot.   Radiographs: None today Assessment:   1. 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  Ulcer R 1st MPJ -Debridement as below. -Dressed with prisma, DSD. -Continue off-loading with surgical shoe.  Procedure: Excisional Debridement of Wound Rationale: Removal of non-viable soft tissue from the wound to promote healing.  Anesthesia: none Pre-Debridement Wound Measurements: 0.8 cm x 1 cm x 0.1 cm  Post-Debridement Wound Measurements: 1 cm x 1 cm x 0.1 cm  Type of Debridement: Sharp Excisional Tissue Removed: Non-viable soft tissue Depth of Debridement: subcutaneous tissue. Technique: Sharp excisional debridement to bleeding, viable wound base.  Dressing: Dry, sterile, compression dressing. Disposition: Patient tolerated procedure well. Patient to return in 1 week for follow-up.   No follow-ups on file.

## 2018-07-28 NOTE — Telephone Encounter (Signed)
Sent Fax [phone]

## 2018-08-04 ENCOUNTER — Encounter: Payer: Self-pay | Admitting: Podiatry

## 2018-08-04 ENCOUNTER — Ambulatory Visit (INDEPENDENT_AMBULATORY_CARE_PROVIDER_SITE_OTHER): Payer: Medicare Other | Admitting: Podiatry

## 2018-08-04 VITALS — BP 109/50 | HR 79 | Temp 98.0°F | Resp 16

## 2018-08-04 DIAGNOSIS — L97511 Non-pressure chronic ulcer of other part of right foot limited to breakdown of skin: Secondary | ICD-10-CM

## 2018-08-04 DIAGNOSIS — M6240 Contracture of muscle, unspecified site: Secondary | ICD-10-CM

## 2018-08-04 DIAGNOSIS — M216X1 Other acquired deformities of right foot: Secondary | ICD-10-CM

## 2018-08-04 DIAGNOSIS — M7742 Metatarsalgia, left foot: Secondary | ICD-10-CM

## 2018-08-04 DIAGNOSIS — E08621 Diabetes mellitus due to underlying condition with foot ulcer: Secondary | ICD-10-CM | POA: Diagnosis not present

## 2018-08-04 NOTE — Progress Notes (Signed)
Subjective:  Patient ID: John Ortiz, male    DOB: [birth year],  MRN: [id number]  Chief Complaint  Patient presents with  . Foot Ulcer    F/U R ulcer Pt. states," it's okay, no pain." tx; imerin, and prisma -FBS: 164 A1C: na -w/ bloody drainage and light redness -pt denies N/V/F?CH   72 y.o. male presents for wound care. History above confirmed with patient.  Review of Systems: Negative except as noted in the HPI. Denies N/V/F/Ch.  Past Medical History:  Diagnosis Date  . Anemia in CKD (chronic kidney disease)   . Aneurysm (HCC)    Left arm  . CHF (congestive heart failure) ([place])   . Chronic kidney disease, stage V ([place])   . Coagulation defect ([place])   . Diabetes mellitus type 2, uncomplicated ([place])   . Diarrhea   . Fluid overload   . Hypercalcemia   . Iron deficiency anemia, unspecified   . Non-healing ulcer of foot ([place])    Right foot  . Pain   . Pruritus   . Renal osteodystrophy   . Secondary hyperparathyroidism of renal origin ([place])   . Shortness of breath   . Type 2 diabetes mellitus with hypoglycemia without coma ([place])   . Unspecified protein-calorie malnutrition ([place])     Current Outpatient Medications:  .  aspirin EC 81 MG tablet, Take 81 mg by mouth daily., Disp: , Rfl:  .  atorvastatin (LIPITOR) 40 MG tablet, Take 1 tablet (40 mg total) by mouth daily., Disp: 90 tablet, Rfl: 3 .  B Complex-C-Folic Acid (DIALYVITE 626) 0.8 MG TABS, Take by mouth., Disp: , Rfl:  .  calcium acetate (PHOSLO) 667 MG capsule, Take 2,001 mg by mouth 3 (three) times daily with meals., Disp: , Rfl:  .  cinacalcet (SENSIPAR) 60 MG tablet, Take by mouth., Disp: , Rfl:  .  clopidogrel (PLAVIX) 75 MG tablet, Take 1 tablet (75 mg total) by mouth daily., Disp: 30 tablet, Rfl: 11 .  Cyanocobalamin (B-12) 500 MCG TABS, Take 500 mcg by mouth 3 (three) times daily. , Disp: , Rfl:  .  FLUoxetine (PROZAC) 20 MG capsule, Take 20 mg by mouth daily., Disp: , Rfl:  .  furosemide (LASIX) 40 MG  tablet, Take 40 mg by mouth daily. , Disp: , Rfl:  .  insulin aspart (NOVOLOG FLEXPEN) 100 UNIT/ML FlexPen, Inject 7-15 Units into the skin 3 (three) times daily with meals. Per sliding scale, Disp: , Rfl:  .  insulin glargine (LANTUS) 100 UNIT/ML injection, Inject 4 Units into the skin 2 (two) times daily. , Disp: , Rfl:  .  silver sulfADIAZINE (SILVADENE) 1 % cream, Apply pea-sized amount to wound daily., Disp: 50 g, Rfl: 0  Social History   Tobacco Use  Smoking Status Former Smoker  Smokeless Tobacco Never Used    Allergies  Allergen Reactions  . Promethazine Other (See Comments)    Tremors, cold, restlessness.  . Povidone-Iodine Rash   Objective:   Vitals:   08/04/18 0946  BP: (!) 109/50  Pulse: 79  Resp: 16  Temp: 98 F (36.7 C)   There is no height or weight on file to calculate BMI. Constitutional Well developed. Well nourished.  Vascular Dorsalis pedis pulses palpable bilaterally. Posterior tibial pulses palpable bilaterally. Capillary refill normal to all digits.  No cyanosis or clubbing noted. Pedal hair growth normal.  Neurologic Normal speech. Oriented to person, place, and time. Protective sensation absent  Dermatologic Wound Location: R 1st MPJ Wound Base:  Granular/Healthy Peri-wound: calloused. Exudate: moderate amount Serosanguinous exudate Wound Measurements: -04/13/18: 4.5x3 post-debridement -04/28/18: 2.5x2.5 post-debridement -05/11/18: 2x1.5 post-debridement -05/26/18: 2x1.5 post-debridement -06/09/18: 1.5x1 post-debridement -06/29/18: 2x2 post-debridement -11/12-19 1.5x1.5 post-debridement -11/19: 1x1 post-debridement -11/26: 1x1 post-debridement -12/3: 1x1 post-debridement -12/10: 1.2x1.2 post-debridement  Orthopedic: Decreased AJ ROM to neutral only right    Radiographs: None today Assessment:   1. 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  Ulcer R 1st MPJ -Debridement as below. -Dressed with prisma, DSD. -Continue off-loading with surgical shoe.  Procedure: Excisional Debridement of Wound Rationale: Removal of non-viable soft tissue from the wound to promote healing.  Anesthesia: none Pre-Debridement Wound Measurements: 1 cm x 1 cm x 0.2 cm  Post-Debridement Wound Measurements: 1.2 cm x 1.2 cm x 0.2 cm  Type of Debridement: Sharp Excisional Tissue Removed: Non-viable soft tissue Depth of Debridement: subcutaneous tissue. Technique: Sharp excisional debridement to bleeding, viable wound base.  Dressing: Dry, sterile, compression dressing. Disposition: Patient tolerated procedure well. Patient to return in 1 week for follow-up.  Equinus, Metatarsalgia -Would consider Achilles tendon lengthening to assist in healing if wound remains stalled. Consider Gastroc vs TAL -Will discuss at ensuing visits. -Would consider boot immobilization after procedure   Return in about 1 week (around 08/11/2018).

## 2018-08-06 DIAGNOSIS — L739 Follicular disorder, unspecified: Secondary | ICD-10-CM | POA: Insufficient documentation

## 2018-08-11 ENCOUNTER — Encounter: Payer: Self-pay | Admitting: Podiatry

## 2018-08-11 ENCOUNTER — Ambulatory Visit (INDEPENDENT_AMBULATORY_CARE_PROVIDER_SITE_OTHER): Payer: Medicare Other | Admitting: Podiatry

## 2018-08-11 VITALS — BP 107/54 | HR 73 | Temp 98.1°F | Resp 16

## 2018-08-11 DIAGNOSIS — E08621 Diabetes mellitus due to underlying condition with foot ulcer: Secondary | ICD-10-CM | POA: Diagnosis not present

## 2018-08-11 DIAGNOSIS — L97511 Non-pressure chronic ulcer of other part of right foot limited to breakdown of skin: Secondary | ICD-10-CM

## 2018-08-12 DIAGNOSIS — E113493 Type 2 diabetes mellitus with severe nonproliferative diabetic retinopathy without macular edema, bilateral: Secondary | ICD-10-CM | POA: Insufficient documentation

## 2018-08-21 ENCOUNTER — Ambulatory Visit: Payer: Medicare Other | Admitting: Podiatry

## 2018-08-23 NOTE — Progress Notes (Signed)
Subjective:  Patient ID: John Ortiz, male    DOB: 23-Jun-1946,  MRN: [id number]  Chief Complaint  Patient presents with  . Foot Ulcer    F/U R foot ulcer Pt. states," I think it's getting a little better, there's just a little pain; 2/10." Tx": imerin cream, prisma and ppost op shoe -pt denies N/V/F?CH/rendess/ swelling -w/ bloody drainage -FBS: 77 A1C: na   72 y.o. male presents for wound care. History above confirmed with patient.  Review of Systems: Negative except as noted in the HPI. Denies N/V/F/Ch.  Past Medical History:  Diagnosis Date  . Anemia in CKD (chronic kidney disease)   . Aneurysm (HCC)    Left arm  . CHF (congestive heart failure) ([place])   . Chronic kidney disease, stage V ([place])   . Coagulation defect ([place])   . Diabetes mellitus type 2, uncomplicated ([place])   . Diarrhea   . Fluid overload   . Hypercalcemia   . Iron deficiency anemia, unspecified   . Non-healing ulcer of foot ([place])    Right foot  . Pain   . Pruritus   . Renal osteodystrophy   . Secondary hyperparathyroidism of renal origin ([place])   . Shortness of breath   . Type 2 diabetes mellitus with hypoglycemia without coma ([place])   . Unspecified protein-calorie malnutrition ([place])     Current Outpatient Medications:  .  aspirin EC 81 MG tablet, Take 81 mg by mouth daily., Disp: , Rfl:  .  atorvastatin (LIPITOR) 40 MG tablet, Take 1 tablet (40 mg total) by mouth daily., Disp: 90 tablet, Rfl: 3 .  B Complex-C-Folic Acid (DIALYVITE 086) 0.8 MG TABS, Take by mouth., Disp: , Rfl:  .  calcium acetate (PHOSLO) 667 MG capsule, Take 2,001 mg by mouth 3 (three) times daily with meals., Disp: , Rfl:  .  cinacalcet (SENSIPAR) 60 MG tablet, Take by mouth., Disp: , Rfl:  .  clopidogrel (PLAVIX) 75 MG tablet, Take 1 tablet (75 mg total) by mouth daily., Disp: 30 tablet, Rfl: 11 .  Cyanocobalamin (B-12) 500 MCG TABS, Take 500 mcg by mouth 3 (three) times daily. , Disp: , Rfl:  .  FLUoxetine (PROZAC) 20 MG  capsule, Take 20 mg by mouth daily., Disp: , Rfl:  .  furosemide (LASIX) 40 MG tablet, Take 40 mg by mouth daily. , Disp: , Rfl:  .  insulin aspart (NOVOLOG FLEXPEN) 100 UNIT/ML FlexPen, Inject 7-15 Units into the skin 3 (three) times daily with meals. Per sliding scale, Disp: , Rfl:  .  insulin glargine (LANTUS) 100 UNIT/ML injection, Inject 4 Units into the skin 2 (two) times daily. , Disp: , Rfl:  .  mupirocin ointment (BACTROBAN) 2 %, Apply topically., Disp: , Rfl:  .  silver sulfADIAZINE (SILVADENE) 1 % cream, Apply pea-sized amount to wound daily., Disp: 50 g, Rfl: 0  Social History   Tobacco Use  Smoking Status Former Smoker  Smokeless Tobacco Never Used    Allergies  Allergen Reactions  . Promethazine Other (See Comments)    Tremors, cold, restlessness.  . Povidone-Iodine Rash   Objective:   Vitals:   08/11/18 1011  BP: (!) 107/54  Pulse: 73  Resp: 16  Temp: 98.1 F (36.7 C)   There is no height or weight on file to calculate BMI. Constitutional Well developed. Well nourished.  Vascular Dorsalis pedis pulses palpable bilaterally. Posterior tibial pulses palpable bilaterally. Capillary refill normal to all digits.  No cyanosis or clubbing noted. Pedal  hair growth normal.  Neurologic Normal speech. Oriented to person, place, and time. Protective sensation absent  Dermatologic Wound Location: R 1st MPJ Wound Base: Granular/Healthy Peri-wound: calloused. Exudate: moderate amount Serosanguinous exudate Wound Measurements: -04/13/18: 4.5x3 post-debridement -04/28/18: 2.5x2.5 post-debridement -05/11/18: 2x1.5 post-debridement -05/26/18: 2x1.5 post-debridement -06/09/18: 1.5x1 post-debridement -06/29/18: 2x2 post-debridement -11/12-19 1.5x1.5 post-debridement -11/19: 1x1 post-debridement -11/26: 1x1 post-debridement -12/3: 1x1 post-debridement -12/10: 1.2x1.2 post-debridement -12/17: 1x1  Orthopedic: Decreased AJ ROM to neutral only right    Radiographs: None  today Assessment:   1. 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  Ulcer R 1st MPJ -Debridement as below. -Dressed with prisma, DSD. -Continue off-loading with surgical shoe.  Procedure: Excisional Debridement of Wound Rationale: Removal of non-viable soft tissue from the wound to promote healing.  Anesthesia: none Pre-Debridement Wound Measurements: 0.5 cm x 0.5 cm x 0.1 cm  Post-Debridement Wound Measurements: 1 cm x 1 cm x 0.1 cm  Type of Debridement: Sharp Excisional Tissue Removed: Non-viable soft tissue Depth of Debridement: subcutaneous tissue. Technique: Sharp excisional debridement to bleeding, viable wound base.  Dressing: Dry, sterile, compression dressing. Disposition: Patient tolerated procedure well. Patient to return in 1 week for follow-up.     Equinus, Metatarsalgia -Would consider Achilles tendon lengthening to assist in healing if wound remains stalled. Consider Gastroc vs TAL -Will discuss at ensuing visits. -Would consider boot immobilization after procedure   No follow-ups on file.

## 2018-08-25 ENCOUNTER — Ambulatory Visit (INDEPENDENT_AMBULATORY_CARE_PROVIDER_SITE_OTHER): Payer: Medicare Other | Admitting: Podiatry

## 2018-08-25 DIAGNOSIS — M2042 Other hammer toe(s) (acquired), left foot: Secondary | ICD-10-CM

## 2018-08-25 DIAGNOSIS — Z992 Dependence on renal dialysis: Secondary | ICD-10-CM

## 2018-08-25 DIAGNOSIS — E08621 Diabetes mellitus due to underlying condition with foot ulcer: Secondary | ICD-10-CM

## 2018-08-25 DIAGNOSIS — M7741 Metatarsalgia, right foot: Secondary | ICD-10-CM | POA: Diagnosis not present

## 2018-08-25 DIAGNOSIS — M6240 Contracture of muscle, unspecified site: Secondary | ICD-10-CM | POA: Diagnosis not present

## 2018-08-25 DIAGNOSIS — M7742 Metatarsalgia, left foot: Secondary | ICD-10-CM

## 2018-08-25 DIAGNOSIS — L97511 Non-pressure chronic ulcer of other part of right foot limited to breakdown of skin: Secondary | ICD-10-CM

## 2018-08-25 DIAGNOSIS — I120 Hypertensive chronic kidney disease with stage 5 chronic kidney disease or end stage renal disease: Secondary | ICD-10-CM

## 2018-08-25 DIAGNOSIS — M216X1 Other acquired deformities of right foot: Secondary | ICD-10-CM

## 2018-08-25 DIAGNOSIS — E1322 Other specified diabetes mellitus with diabetic chronic kidney disease: Secondary | ICD-10-CM

## 2018-08-25 DIAGNOSIS — N186 End stage renal disease: Secondary | ICD-10-CM

## 2018-08-25 NOTE — Progress Notes (Signed)
Subjective:  Patient ID: John Ortiz, male    DOB: [birth year],  MRN: [id number]  Chief Complaint  Patient presents with  . Foot Ulcer    fu ulcer sub met hallux right foot seems smaller still draining   . Callouses    new callus on hammered 2nd toe left bothersome    72 y.o. male presents for wound care. History above confirmed with patient.   New complaint of left 2nd hammertoe with callus new in the past few weeks. Becoming bothersome.  Review of Systems: Negative except as noted in the HPI. Denies N/V/F/Ch.  Past Medical History:  Diagnosis Date  . Anemia in CKD (chronic kidney disease)   . Aneurysm (HCC)    Left arm  . CHF (congestive heart failure) ([place])   . Chronic kidney disease, stage V ([place])   . Coagulation defect ([place])   . Diabetes mellitus type 2, uncomplicated ([place])   . Diarrhea   . Fluid overload   . Hypercalcemia   . Iron deficiency anemia, unspecified   . Non-healing ulcer of foot ([place])    Right foot  . Pain   . Pruritus   . Renal osteodystrophy   . Secondary hyperparathyroidism of renal origin ([place])   . Shortness of breath   . Type 2 diabetes mellitus with hypoglycemia without coma ([place])   . Unspecified protein-calorie malnutrition ([place])     Current Outpatient Medications:  .  aspirin EC 81 MG tablet, Take 81 mg by mouth daily., Disp: , Rfl:  .  atorvastatin (LIPITOR) 40 MG tablet, Take 1 tablet (40 mg total) by mouth daily., Disp: 90 tablet, Rfl: 3 .  B Complex-C-Folic Acid (DIALYVITE 446) 0.8 MG TABS, Take by mouth., Disp: , Rfl:  .  calcium acetate (PHOSLO) 667 MG capsule, Take 2,001 mg by mouth 3 (three) times daily with meals., Disp: , Rfl:  .  cinacalcet (SENSIPAR) 60 MG tablet, Take by mouth., Disp: , Rfl:  .  clopidogrel (PLAVIX) 75 MG tablet, Take 1 tablet (75 mg total) by mouth daily., Disp: 30 tablet, Rfl: 11 .  Cyanocobalamin (B-12) 500 MCG TABS, Take 500 mcg by mouth 3 (three) times daily. , Disp: , Rfl:  .  FLUoxetine (PROZAC) 20  MG capsule, Take 20 mg by mouth daily., Disp: , Rfl:  .  furosemide (LASIX) 40 MG tablet, Take 40 mg by mouth daily. , Disp: , Rfl:  .  insulin aspart (NOVOLOG FLEXPEN) 100 UNIT/ML FlexPen, Inject 7-15 Units into the skin 3 (three) times daily with meals. Per sliding scale, Disp: , Rfl:  .  insulin glargine (LANTUS) 100 UNIT/ML injection, Inject 4 Units into the skin 2 (two) times daily. , Disp: , Rfl:  .  mupirocin ointment (BACTROBAN) 2 %, Apply topically., Disp: , Rfl:  .  silver sulfADIAZINE (SILVADENE) 1 % cream, Apply pea-sized amount to wound daily., Disp: 50 g, Rfl: 0  Social History   Tobacco Use  Smoking Status Former Smoker  Smokeless Tobacco Never Used    Allergies  Allergen Reactions  . Promethazine Other (See Comments)    Tremors, cold, restlessness.  . Povidone-Iodine Rash   Objective:   There were no vitals filed for this visit. There is no height or weight on file to calculate BMI. Constitutional Well developed. Well nourished.  Vascular Dorsalis pedis pulses palpable bilaterally. Posterior tibial pulses palpable bilaterally. Capillary refill normal to all digits.  No cyanosis or clubbing noted. Pedal hair growth normal.  Neurologic Normal speech. Oriented  to person, place, and time. Protective sensation absent  Dermatologic Wound Location: R 1st MPJ Wound Base: Granular/Healthy Peri-wound: calloused. 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 Diabetic ulcer of other part of right foot associated with diabetes mellitus due to underlying condition, limited to breakdown of skin ([place])   2. Acquired equinus deformity of right foot   3. Contracture of tendon sheath   4. Metatarsalgia of left foot   5. Hammertoe of left foot   6. Metatarsalgia, right foot   7. 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  Ulcer R 1st MPJ -Debridement as below. -Dressed with silvadene -Continue off-loading with surgical shoe. -Wound has failed to progress significantly -Will plan for debridement, skin graft substitute concomitant to tendoachilles lengthening -Planned procedures: Debridment of ulcer, skin graft substitute  Procedure: Excisional Debridement of Wound Rationale: Removal of non-viable soft tissue from the wound to promote healing.  Anesthesia: none Pre-Debridement Wound Measurements: 0.5 cm x 0.5 cm x 0.2 cm  Post-Debridement Wound Measurements: 1 cm x 1 cm x 0.2 cm  Type of Debridement: Sharp Excisional Tissue Removed: Non-viable soft tissue Depth of Debridement: subcutaneous tissue. Technique: Sharp excisional debridement to bleeding, viable wound base.  Dressing: Dry, sterile, compression dressing. Disposition: Patient tolerated procedure well. Patient to return in 1 week for follow-up.  R Equinus, Metatarsalgia -Will plan for Achilles tendon lengthening to assist in healing since wound remains stalled. Consider Gastroc vs TAL. -Will allow WB in a total contact cast. Will facilitate delivery of TCC and boot to hospital for surgery. -Patient has failed all conservative therapy and wishes to proceed with surgical intervention. All risks, benefits, and alternatives discussed with patient. No guarantees given. Consent  reviewed and signed by patient. -Planned procedures: Right Tendoachilles Lengthening, total contact cast  Left 2nd Hammertoe with Pre-ulcerative lesion -Likely given substitution from absence of hallux -Given pre-ulcerative lesion will plan for surgical correction of the hammertoe deformity  -Deformity too rigid for simple flexor tenotomy, but likely tenotomy will need to be performed concomitantly. -Planned procedures: Left 2nd toe Hammertoe correction, likely flexor tenotomy  Hope for surgery next week pending availability. Total planned procedures: Debridement of ulcer, skin graft substitute, right tendoachilles lengthening, total contact cast, left second hammertoe correction, left 2nd toe flexor tenotomy  Of note office consent was signed prior to discussion of additional adjunctive procedures of the wound debridement, total contact cast, and skin graft substitute. Will addend and need patient updated signature, however the hospital consent that will be signed the DOS will supersede the one done in the office today.   30 minutes of face to face time were spent with the patient. >50% of this was spent on counseling and coordination of care. Specifically discussed with patient the above diagnoses and overall treatment plan. Plan for surgery as above. Counseling and coordination of care outside of procedure time for wound debridement.   No follow-ups on file.

## 2018-08-25 NOTE — Patient Instructions (Signed)
Pre-Operative Instructions  Congratulations, you have decided to take an important step towards improving your quality of life.  You can be assured that the doctors and staff at [facility] will be with you every step of the way.  Here are some important things you should know:  1. Plan to be at the surgery center/hospital at least 1 (one) hour prior to your scheduled time, unless otherwise directed by the surgical center/hospital staff.  You must have a responsible adult accompany you, remain during the surgery and drive you home.  Make sure you have directions to the surgical center/hospital to ensure you arrive on time. 2. If you are having surgery at [facility] or [facility] hospitals, you will need a copy of your medical history and physical form from your family physician within one month prior to the date of surgery. We will give you a form for your primary physician to complete.  3. We make every effort to accommodate the date you request for surgery.  However, there are times where surgery dates or times have to be moved.  We will contact you as soon as possible if a change in schedule is required.   4. No aspirin/ibuprofen for one week before surgery.  If you are on aspirin, any non-steroidal anti-inflammatory medications (Mobic, Aleve, Ibuprofen) should not be taken seven (7) days prior to your surgery.  You make take Tylenol for pain prior to surgery.  5. Medications - If you are taking daily heart and blood pressure medications, seizure, reflux, allergy, asthma, anxiety, pain or diabetes medications, make sure you notify the surgery center/hospital before the day of surgery so they can tell you which medications you should take or avoid the day of surgery. 6. No food or drink after midnight the night before surgery unless directed otherwise by surgical center/hospital staff. 7. No alcoholic beverages 24-hours prior to surgery.  No smoking 24-hours prior or 24-hours after  surgery. 8. Wear loose pants or shorts. They should be loose enough to fit over bandages, boots, and casts. 9. Don't wear slip-on shoes. Sneakers are preferred. 10. Bring your boot with you to the surgery center/hospital.  Also bring crutches or a walker if your physician has prescribed it for you.  If you do not have this equipment, it will be provided for you after surgery. 11. If you have not been contacted by the surgery center/hospital by the day before your surgery, call to confirm the date and time of your surgery. 12. Leave-time from work may vary depending on the type of surgery you have.  Appropriate arrangements should be made prior to surgery with your employer. 13. Prescriptions will be provided immediately following surgery by your doctor.  Fill these as soon as possible after surgery and take the medication as directed. Pain medications will not be refilled on weekends and must be approved by the doctor. 14. Remove nail polish on the operative foot and avoid getting pedicures prior to surgery. 15. Wash the night before surgery.  The night before surgery wash the foot and leg well with water and the antibacterial soap provided. Be sure to pay special attention to beneath the toenails and in between the toes.  Wash for at least three (3) minutes. Rinse thoroughly with water and dry well with a towel.  Perform this wash unless told not to do so by your physician.  Enclosed: 1 Ice pack (please put in freezer the night before surgery)   1 Hibiclens skin cleaner     Pre-op instructions  If you have any questions regarding the instructions, please do not hesitate to call our office.  [place]: [address], , [place] [address] -- [phone]  [place]: [address] [place], [place] [address] -- [phone]  [place]: [address]  [place], [place] [address] -- [phone]  [place]: [address], [address], [place], [place] [address] -- [phone]  Website:  [email or web address] 

## 2018-08-27 ENCOUNTER — Telehealth: Payer: Self-pay | Admitting: *Deleted

## 2018-08-27 NOTE — Telephone Encounter (Addendum)
"  This is John Ortiz.  Apparently you already know about my operation going to be on the seventh of January.  Thank you so much and thank you for being in charge.  You are the woman in charge to say the very least.  Thank you very much and you have a great day."

## 2018-08-28 ENCOUNTER — Other Ambulatory Visit: Payer: Self-pay | Admitting: *Deleted

## 2018-08-28 DIAGNOSIS — I739 Peripheral vascular disease, unspecified: Secondary | ICD-10-CM

## 2018-08-31 NOTE — Telephone Encounter (Signed)
"  Calling to see if you have a H&P on John Ortiz for tomorrow.  We don't have one on him yet.  If you could, check on that for me."  I left the patient a message to call me first thing in the morning.  I need to ask him about the history and physical form.

## 2018-09-01 DIAGNOSIS — Z01818 Encounter for other preprocedural examination: Secondary | ICD-10-CM | POA: Diagnosis not present

## 2018-09-01 DIAGNOSIS — M216X1 Other acquired deformities of right foot: Secondary | ICD-10-CM | POA: Diagnosis not present

## 2018-09-01 DIAGNOSIS — L97511 Non-pressure chronic ulcer of other part of right foot limited to breakdown of skin: Secondary | ICD-10-CM | POA: Diagnosis not present

## 2018-09-01 DIAGNOSIS — E11621 Type 2 diabetes mellitus with foot ulcer: Secondary | ICD-10-CM | POA: Diagnosis not present

## 2018-09-01 DIAGNOSIS — M2042 Other hammer toe(s) (acquired), left foot: Secondary | ICD-10-CM | POA: Diagnosis not present

## 2018-09-03 ENCOUNTER — Encounter: Payer: Self-pay | Admitting: Podiatry

## 2018-09-08 ENCOUNTER — Encounter: Payer: Self-pay | Admitting: Podiatry

## 2018-09-08 ENCOUNTER — Ambulatory Visit (INDEPENDENT_AMBULATORY_CARE_PROVIDER_SITE_OTHER): Payer: Medicare Other

## 2018-09-08 ENCOUNTER — Ambulatory Visit (INDEPENDENT_AMBULATORY_CARE_PROVIDER_SITE_OTHER): Payer: Medicare Other | Admitting: Podiatry

## 2018-09-08 DIAGNOSIS — M6240 Contracture of muscle, unspecified site: Secondary | ICD-10-CM | POA: Diagnosis not present

## 2018-09-08 DIAGNOSIS — Z9889 Other specified postprocedural states: Secondary | ICD-10-CM

## 2018-09-08 DIAGNOSIS — M216X1 Other acquired deformities of right foot: Secondary | ICD-10-CM

## 2018-09-08 DIAGNOSIS — M7742 Metatarsalgia, left foot: Secondary | ICD-10-CM

## 2018-09-08 NOTE — Progress Notes (Signed)
Subjective:  Patient ID: John Ortiz, male    DOB: Feb 25, 1946,  MRN: [id number]  Chief Complaint  Patient presents with  . Cast Removal    cast removal  . Routine Post Op     pov#1 dos 01.07.2020 Tendo-Achilles Length Rt, Hammertoe Repair 2nd Lt, Ulcer Debridement Rt, Skin Graft Substitute Rt, Total Contact Cast Rt   DOS: 09/01/2018 Procedure: Rt Tendoachilles lengthening, debridement of ulcer, application of skin graft substitute, application of TCC. Correction of Left 2nd hammertoe  73 y.o. male returns for post-op check. Doing well states that he has had no pain after the procedure denies problems today.  Review of Systems: Negative except as noted in the HPI. Denies N/V/F/Ch.  Past Medical History:  Diagnosis Date  . Anemia in CKD (chronic kidney disease)   . Aneurysm (HCC)    Left arm  . CHF (congestive heart failure) ([place])   . Chronic kidney disease, stage V ([place])   . Coagulation defect ([place])   . Diabetes mellitus type 2, uncomplicated ([place])   . Diarrhea   . Fluid overload   . Hypercalcemia   . Iron deficiency anemia, unspecified   . Non-healing ulcer of foot ([place])    Right foot  . Pain   . Pruritus   . Renal osteodystrophy   . Secondary hyperparathyroidism of renal origin ([place])   . Shortness of breath   . Type 2 diabetes mellitus with hypoglycemia without coma ([place])   . Unspecified protein-calorie malnutrition ([place])     Current Outpatient Medications:  .  aspirin EC 81 MG tablet, Take 81 mg by mouth daily., Disp: , Rfl:  .  atorvastatin (LIPITOR) 40 MG tablet, Take 1 tablet (40 mg total) by mouth daily., Disp: 90 tablet, Rfl: 3 .  B Complex-C-Folic Acid (DIALYVITE 412) 0.8 MG TABS, Take by mouth., Disp: , Rfl:  .  calcium acetate (PHOSLO) 667 MG capsule, Take 2,001 mg by mouth 3 (three) times daily with meals., Disp: , Rfl:  .  cinacalcet (SENSIPAR) 60 MG tablet, Take by mouth., Disp: , Rfl:  .  clopidogrel (PLAVIX) 75 MG tablet, Take 1 tablet (75 mg  total) by mouth daily., Disp: 30 tablet, Rfl: 11 .  Cyanocobalamin (B-12) 500 MCG TABS, Take 500 mcg by mouth 3 (three) times daily. , Disp: , Rfl:  .  FLUoxetine (PROZAC) 20 MG capsule, Take 20 mg by mouth daily., Disp: , Rfl:  .  furosemide (LASIX) 40 MG tablet, Take 40 mg by mouth daily. , Disp: , Rfl:  .  HYDROcodone-acetaminophen (NORCO) 10-325 MG tablet, TAKE 1 TABLET BY MOUTH EVERY 4 HOURS AS NEEDED FOR PAIN. MAX OF 5 TABLETS PER DAY, Disp: , Rfl:  .  insulin aspart (NOVOLOG FLEXPEN) 100 UNIT/ML FlexPen, Inject 7-15 Units into the skin 3 (three) times daily with meals. Per sliding scale, Disp: , Rfl:  .  insulin glargine (LANTUS) 100 UNIT/ML injection, Inject 4 Units into the skin 2 (two) times daily. , Disp: , Rfl:  .  mupirocin ointment (BACTROBAN) 2 %, Apply topically., Disp: , Rfl:  .  silver sulfADIAZINE (SILVADENE) 1 % cream, Apply pea-sized amount to wound daily., Disp: 50 g, Rfl: 0  Social History   Tobacco Use  Smoking Status Former Smoker  Smokeless Tobacco Never Used    Allergies  Allergen Reactions  . Promethazine Other (See Comments)    Tremors, cold, restlessness.  . Povidone-Iodine Rash   Objective:  There were no vitals filed for this visit. There  is no height or weight on file to calculate BMI. Constitutional Well developed. Well nourished.  Vascular Foot warm and well perfused. Capillary refill normal to all digits.   Neurologic Normal speech. Oriented to person, place, and time. Epicritic sensation to light touch grossly present bilaterally.  Dermatologic Skin healing well sutures and staples intact UTA R 1st MPJ ulcer due to graft.  Orthopedic: No tenderness to palpation noted about the surgical site. 5 degrees of ankle DF ROM right   Radiographs: None Assessment:   1. Acquired equinus deformity of right foot   2. Contracture of tendon sheath   3. Metatarsalgia of left foot   4. Post-operative state    Plan:  Patient was evaluated and treated  and all questions answered.  S/p foot surgery bilaterally -Progressing as expected post-operatively. -XR: none -WB Status: WBAT in Cam boot right, surgical shoe left -Sutures: intact. -Medications: none refilled today. -TCC removed today. -Refer to PT for gentle ankle mobilizations -Foot redressed.  R 1st MPJ Ulcer -Dressing changed with hydrogel and non-adherent dressing.  No follow-ups on file.

## 2018-09-15 ENCOUNTER — Other Ambulatory Visit: Payer: Self-pay

## 2018-09-15 ENCOUNTER — Ambulatory Visit (INDEPENDENT_AMBULATORY_CARE_PROVIDER_SITE_OTHER): Payer: Medicare Other | Admitting: Podiatry

## 2018-09-15 DIAGNOSIS — Z9889 Other specified postprocedural states: Secondary | ICD-10-CM

## 2018-09-19 NOTE — Progress Notes (Signed)
Subjective:  Patient ID: John Ortiz, male    DOB: 1946/06/14,  MRN: [id number]  Chief Complaint  Patient presents with  . Routine Post Op    POV #2 Pt. states," feel fine no complaints." Tx: cam boot and post op shoe -pt deneis N/V/F/Ch   DOS: 09/01/2018 Procedure: Rt Tendoachilles lengthening, debridement of ulcer, application of skin graft substitute, application of TCC. Correction of Left 2nd hammertoe  73 y.o. male returns for post-op check. Denies issues today.  Review of Systems: Negative except as noted in the HPI. Denies N/V/F/Ch.  Past Medical History:  Diagnosis Date  . Anemia in CKD (chronic kidney disease)   . Aneurysm (HCC)    Left arm  . CHF (congestive heart failure) ([place])   . Chronic kidney disease, stage V ([place])   . Coagulation defect ([place])   . Diabetes mellitus type 2, uncomplicated ([place])   . Diarrhea   . Fluid overload   . Hypercalcemia   . Iron deficiency anemia, unspecified   . Non-healing ulcer of foot ([place])    Right foot  . Pain   . Pruritus   . Renal osteodystrophy   . Secondary hyperparathyroidism of renal origin ([place])   . Shortness of breath   . Type 2 diabetes mellitus with hypoglycemia without coma ([place])   . Unspecified protein-calorie malnutrition ([place])     Current Outpatient Medications:  .  aspirin EC 81 MG tablet, Take 81 mg by mouth daily., Disp: , Rfl:  .  atorvastatin (LIPITOR) 40 MG tablet, Take 1 tablet (40 mg total) by mouth daily., Disp: 90 tablet, Rfl: 3 .  B Complex-C-Folic Acid (DIALYVITE 732) 0.8 MG TABS, Take by mouth., Disp: , Rfl:  .  calcium acetate (PHOSLO) 667 MG capsule, Take 2,001 mg by mouth 3 (three) times daily with meals., Disp: , Rfl:  .  cephALEXin (KEFLEX) 500 MG capsule, Take 500 mg by mouth 2 (two) times daily., Disp: , Rfl:  .  cinacalcet (SENSIPAR) 60 MG tablet, Take by mouth., Disp: , Rfl:  .  clopidogrel (PLAVIX) 75 MG tablet, Take 1 tablet (75 mg total) by mouth daily., Disp: 30 tablet, Rfl: 11 .   Cyanocobalamin (B-12) 500 MCG TABS, Take 500 mcg by mouth 3 (three) times daily. , Disp: , Rfl:  .  FLUoxetine (PROZAC) 20 MG capsule, Take 20 mg by mouth daily., Disp: , Rfl:  .  furosemide (LASIX) 40 MG tablet, Take 40 mg by mouth daily. , Disp: , Rfl:  .  HYDROcodone-acetaminophen (NORCO) 10-325 MG tablet, TAKE 1 TABLET BY MOUTH EVERY 4 HOURS AS NEEDED FOR PAIN. MAX OF 5 TABLETS PER DAY, Disp: , Rfl:  .  insulin aspart (NOVOLOG FLEXPEN) 100 UNIT/ML FlexPen, Inject 7-15 Units into the skin 3 (three) times daily with meals. Per sliding scale, Disp: , Rfl:  .  insulin glargine (LANTUS) 100 UNIT/ML injection, Inject 4 Units into the skin 2 (two) times daily. , Disp: , Rfl:  .  mupirocin ointment (BACTROBAN) 2 %, Apply topically., Disp: , Rfl:  .  silver sulfADIAZINE (SILVADENE) 1 % cream, Apply pea-sized amount to wound daily., Disp: 50 g, Rfl: 0  Social History   Tobacco Use  Smoking Status Former Smoker  Smokeless Tobacco Never Used    Allergies  Allergen Reactions  . Promethazine Other (See Comments)    Tremors, cold, restlessness.  . Povidone-Iodine Rash   Objective:  There were no vitals filed for this visit. There is no height or weight  on file to calculate BMI. Constitutional Well developed. Well nourished.  Vascular Foot warm and well perfused. Capillary refill normal to all digits.   Neurologic Normal speech. Oriented to person, place, and time. Epicritic sensation to light touch grossly present bilaterally.  Dermatologic Skin healing well sutures and staples intact Right first MPJ ulceration 2 x 2 with fibro-granular base mild malodor maceration no warmth erythema signs of acute infection  Orthopedic: No tenderness to palpation noted about the surgical site. 5 degrees of ankle DF ROM right    Radiographs: None Assessment:   1. Post-operative state    Plan:  Patient was evaluated and treated and all questions answered.  S/p foot surgery bilaterally -Progressing  as expected post-operatively.  Left second hammertoe slightly plantarflexed however no rigid deformity present dorsally -XR: none -WB Status: WBAT in Cam boot right, surgical shoe left -Sutures: intact. -Medications: none refilled today. -Foot redressed.  R 1st MPJ Ulcer -Dressing changed with betadine WTD -Pt to apply silvadene and dressing daily -Discussed with patient that he may benefit from metatarsal resection in the future should ulceration on progress.  We will get x-rays next visit with a marker on the ulceration to ensure the metatarsal is the cause of the excess pressure.  Return in about 1 week (around 09/22/2018).

## 2018-09-22 ENCOUNTER — Ambulatory Visit (INDEPENDENT_AMBULATORY_CARE_PROVIDER_SITE_OTHER): Payer: Medicare Other

## 2018-09-22 ENCOUNTER — Ambulatory Visit (INDEPENDENT_AMBULATORY_CARE_PROVIDER_SITE_OTHER): Payer: Medicare Other | Admitting: Podiatry

## 2018-09-22 ENCOUNTER — Encounter: Payer: Self-pay | Admitting: Podiatry

## 2018-09-22 VITALS — BP 136/68 | HR 90 | Temp 96.7°F | Resp 16

## 2018-09-22 DIAGNOSIS — N186 End stage renal disease: Secondary | ICD-10-CM

## 2018-09-22 DIAGNOSIS — M216X1 Other acquired deformities of right foot: Secondary | ICD-10-CM

## 2018-09-22 DIAGNOSIS — L97511 Non-pressure chronic ulcer of other part of right foot limited to breakdown of skin: Secondary | ICD-10-CM

## 2018-09-22 DIAGNOSIS — E08621 Diabetes mellitus due to underlying condition with foot ulcer: Secondary | ICD-10-CM

## 2018-09-22 DIAGNOSIS — I120 Hypertensive chronic kidney disease with stage 5 chronic kidney disease or end stage renal disease: Secondary | ICD-10-CM

## 2018-09-22 DIAGNOSIS — E1322 Other specified diabetes mellitus with diabetic chronic kidney disease: Secondary | ICD-10-CM

## 2018-09-22 DIAGNOSIS — Z992 Dependence on renal dialysis: Secondary | ICD-10-CM

## 2018-09-22 NOTE — Progress Notes (Signed)
Subjective:  Patient ID: John Ortiz, male    DOB: 08-Sep-1945,  MRN: [id number]  Chief Complaint  Patient presents with  . Routine Post Op    POV # 3 Pt.states," doing okay, there's a little tinlgin at the back of the heel." tx: cam boot (rt) and surgical shoe (lt) -pt denies N/V/F?Ch  . Foot Ulcer    F/U R 1st MPJ ulcer PT. states," about the same size, but looks like it's healing." Tx: silvadene -w/ bloody drainage  . Diabetes    FBS: 99 A1C: na PCP: [facility]   DOS: 09/01/2018 Procedure: Rt Tendoachilles lengthening, debridement of ulcer, application of skin graft substitute, application of TCC. Correction of Left 2nd hammertoe  73 y.o. male returns for post-op check. Denies issues today.  Review of Systems: Negative except as noted in the HPI. Denies N/V/F/Ch.  Past Medical History:  Diagnosis Date  . Anemia in CKD (chronic kidney disease)   . Aneurysm (HCC)    Left arm  . CHF (congestive heart failure) ([name])   . Chronic kidney disease, stage V ([name])   . Coagulation defect ([place])   . Diabetes mellitus type 2, uncomplicated ([name])   . Diarrhea   . Fluid overload   . Hypercalcemia   . Iron deficiency anemia, unspecified   . Non-healing ulcer of foot ([name])    Right foot  . Pain   . Pruritus   . Renal osteodystrophy   . Secondary hyperparathyroidism of renal origin ([name])   . Shortness of breath   . Type 2 diabetes mellitus with hypoglycemia without coma ([name])   . Unspecified protein-calorie malnutrition ([name])     Current Outpatient Medications:  .  aspirin EC 81 MG tablet, Take 81 mg by mouth daily., Disp: , Rfl:  .  atorvastatin (LIPITOR) 40 MG tablet, Take 1 tablet (40 mg total) by mouth daily., Disp: 90 tablet, Rfl: 3 .  B Complex-C-Folic Acid (DIALYVITE 875) 0.8 MG TABS, Take by mouth., Disp: , Rfl:  .  calcium acetate (PHOSLO) 667 MG capsule, Take 2,001 mg by mouth 3 (three) times daily with meals., Disp: , Rfl:  .  cephALEXin (KEFLEX) 500 MG capsule,  Take 500 mg by mouth 2 (two) times daily., Disp: , Rfl:  .  cinacalcet (SENSIPAR) 60 MG tablet, Take by mouth., Disp: , Rfl:  .  clopidogrel (PLAVIX) 75 MG tablet, Take 1 tablet (75 mg total) by mouth daily., Disp: 30 tablet, Rfl: 11 .  Cyanocobalamin (B-12) 500 MCG TABS, Take 500 mcg by mouth 3 (three) times daily. , Disp: , Rfl:  .  FLUoxetine (PROZAC) 20 MG capsule, Take 20 mg by mouth daily., Disp: , Rfl:  .  furosemide (LASIX) 40 MG tablet, Take 40 mg by mouth daily. , Disp: , Rfl:  .  HYDROcodone-acetaminophen (NORCO) 10-325 MG tablet, TAKE 1 TABLET BY MOUTH EVERY 4 HOURS AS NEEDED FOR PAIN. MAX OF 5 TABLETS PER DAY, Disp: , Rfl:  .  insulin aspart (NOVOLOG FLEXPEN) 100 UNIT/ML FlexPen, Inject 7-15 Units into the skin 3 (three) times daily with meals. Per sliding scale, Disp: , Rfl:  .  insulin glargine (LANTUS) 100 UNIT/ML injection, Inject 4 Units into the skin 2 (two) times daily. , Disp: , Rfl:  .  mupirocin ointment (BACTROBAN) 2 %, Apply topically., Disp: , Rfl:  .  silver sulfADIAZINE (SILVADENE) 1 % cream, Apply pea-sized amount to wound daily., Disp: 50 g, Rfl: 0  Social History   Tobacco Use  Smoking Status Former Smoker  Smokeless Tobacco Never Used    Allergies  Allergen Reactions  . Promethazine Other (See Comments)    Tremors, cold, restlessness.  . Povidone-Iodine Rash   Objective:   Vitals:   09/22/18 0940  BP: 136/68  Pulse: 90  Resp: 16  Temp: (!) 96.7 F (35.9 C)   There is no height or weight on file to calculate BMI. Constitutional Well developed. Well nourished.  Vascular Foot warm and well perfused. Capillary refill normal to all digits.   Neurologic Normal speech. Oriented to person, place, and time. Epicritic sensation to light touch grossly present bilaterally.  Dermatologic Skin healing well sutures intact left. Ulcer healed left second toe. Right first MPJ ulceration 2 x 2 with granular base hyperkeratotic rim no warmth erythema signs of  infection.  Orthopedic: No tenderness to palpation noted about the surgical site. 5 degrees of ankle DF ROM right   Radiographs: None Assessment:   1. Diabetic ulcer of other part of right foot associated with diabetes mellitus due to underlying condition, limited to breakdown of skin ([name])   2. Acquired equinus deformity of right foot   3. 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 Skin ulcer of right foot, limited to breakdown of skin [name])    Plan:  Patient was evaluated and treated and all questions answered.  S/p foot surgery bilaterally -Progressing as expected post-operatively.  Left second hammertoe slightly plantarflexed however no rigid deformity and ulceration is healed. Patient pleased. -XR: Taken right. Will get new XR with marker next time -WB Status: WBAT in Cam boot right, surgical shoe left -Sutures: intact. -Medications: none refilled today. -Foot redressed.  R 1st MPJ Ulcer -Debrided as below. -Pt to apply silvadene and dressing daily -Wound is healing hold of metatarsal resection at this time. -We will get x-rays next visit with a marker on the ulceration to ensure the metatarsal is the cause of the excess pressure.  Procedure: Selective Debridement of Wound Rationale: Removal of devitalized tissue from the wound to promote healing.  Pre-Debridement Wound Measurements: 2 cm x 2 cm x 0.1 cm  Post-Debridement Wound Measurements: same as pre-debridement. Type of Debridement: sharp selective Tissue Removed: Devitalized soft-tissue Dressing: Dry, sterile, compression dressing. Disposition: Patient tolerated procedure well. Patient to return in 1 week for follow-up.    Return in about 1 week (around 09/29/2018) for Wound Care, Right.

## 2018-09-24 ENCOUNTER — Telehealth: Payer: Self-pay | Admitting: Cardiology

## 2018-09-24 NOTE — Telephone Encounter (Signed)
Spoke with Caryl Pina from [facility] who states she has abnormal lab results for pt cardiologist, Harrell Gave, MD. She will fax results to [facility] office. Will present results to Dr. Harrell Gave for review

## 2018-09-24 NOTE — Telephone Encounter (Signed)
Results not yet faxed to [facility] office; unable to locate contact number. Will route to Dr. Harrell Gave nurse

## 2018-09-24 NOTE — Telephone Encounter (Signed)
New message    Caryl Pina from dialysis center calling to report abnormal labs

## 2018-09-25 NOTE — Telephone Encounter (Signed)
[place] and spoke with nurse Corey Skains to inform we never received lab results. She states she will refax results.

## 2018-09-29 ENCOUNTER — Ambulatory Visit (INDEPENDENT_AMBULATORY_CARE_PROVIDER_SITE_OTHER): Payer: Medicare Other

## 2018-09-29 ENCOUNTER — Ambulatory Visit (INDEPENDENT_AMBULATORY_CARE_PROVIDER_SITE_OTHER): Payer: Medicare Other | Admitting: Podiatry

## 2018-09-29 VITALS — BP 128/76 | HR 85 | Temp 98.0°F | Resp 16

## 2018-09-29 DIAGNOSIS — L928 Other granulomatous disorders of the skin and subcutaneous tissue: Secondary | ICD-10-CM | POA: Diagnosis not present

## 2018-09-29 DIAGNOSIS — E08621 Diabetes mellitus due to underlying condition with foot ulcer: Secondary | ICD-10-CM

## 2018-09-29 DIAGNOSIS — M216X1 Other acquired deformities of right foot: Secondary | ICD-10-CM

## 2018-09-29 DIAGNOSIS — L97511 Non-pressure chronic ulcer of other part of right foot limited to breakdown of skin: Secondary | ICD-10-CM

## 2018-09-29 DIAGNOSIS — Z9889 Other specified postprocedural states: Secondary | ICD-10-CM

## 2018-09-30 NOTE — Progress Notes (Signed)
Subjective:  Patient ID: John Ortiz, male    DOB: September 10, 1945,  MRN: [id number]  Chief Complaint  Patient presents with  . Foot Ulcer    F/U R 1st MPJ ulcer PT. states," doin getter, I think it's getting smaller." tx: silvadene and bandaid -pt states he has N/V/F?Ch every day -w/ yellow drainage -FBS: 400    DOS: 09/01/2018 Procedure: Rt Tendoachilles lengthening, debridement of ulcer, application of skin graft substitute, application of TCC. Correction of Left 2nd hammertoe  73 y.o. male returns for post-op check. Denies issues today.  Review of Systems: Negative except as noted in the HPI. Denies N/V/F/Ch.  Past Medical History:  Diagnosis Date  . Anemia in CKD (chronic kidney disease)   . Aneurysm (HCC)    Left arm  . CHF (congestive heart failure) ([place])   . Chronic kidney disease, stage V ([place])   . Coagulation defect ([place])   . Diabetes mellitus type 2, uncomplicated ([place])   . Diarrhea   . Fluid overload   . Hypercalcemia   . Iron deficiency anemia, unspecified   . Non-healing ulcer of foot ([place])    Right foot  . Pain   . Pruritus   . Renal osteodystrophy   . Secondary hyperparathyroidism of renal origin ([place])   . Shortness of breath   . Type 2 diabetes mellitus with hypoglycemia without coma ([place])   . Unspecified protein-calorie malnutrition ([place])     Current Outpatient Medications:  .  aspirin EC 81 MG tablet, Take 81 mg by mouth daily., Disp: , Rfl:  .  atorvastatin (LIPITOR) 40 MG tablet, Take 1 tablet (40 mg total) by mouth daily., Disp: 90 tablet, Rfl: 3 .  B Complex-C-Folic Acid (DIALYVITE 025) 0.8 MG TABS, Take by mouth., Disp: , Rfl:  .  calcium acetate (PHOSLO) 667 MG capsule, Take 2,001 mg by mouth 3 (three) times daily with meals., Disp: , Rfl:  .  cephALEXin (KEFLEX) 500 MG capsule, Take 500 mg by mouth 2 (two) times daily., Disp: , Rfl:  .  cinacalcet (SENSIPAR) 60 MG tablet, Take by mouth., Disp: , Rfl:  .  clopidogrel (PLAVIX) 75 MG tablet, Take  1 tablet (75 mg total) by mouth daily., Disp: 30 tablet, Rfl: 11 .  Cyanocobalamin (B-12) 500 MCG TABS, Take 500 mcg by mouth 3 (three) times daily. , Disp: , Rfl:  .  FLUoxetine (PROZAC) 20 MG capsule, Take 20 mg by mouth daily., Disp: , Rfl:  .  furosemide (LASIX) 40 MG tablet, Take 40 mg by mouth daily. , Disp: , Rfl:  .  HYDROcodone-acetaminophen (NORCO) 10-325 MG tablet, TAKE 1 TABLET BY MOUTH EVERY 4 HOURS AS NEEDED FOR PAIN. MAX OF 5 TABLETS PER DAY, Disp: , Rfl:  .  insulin aspart (NOVOLOG FLEXPEN) 100 UNIT/ML FlexPen, Inject 7-15 Units into the skin 3 (three) times daily with meals. Per sliding scale, Disp: , Rfl:  .  insulin glargine (LANTUS) 100 UNIT/ML injection, Inject 4 Units into the skin 2 (two) times daily. , Disp: , Rfl:  .  mupirocin ointment (BACTROBAN) 2 %, Apply topically., Disp: , Rfl:  .  silver sulfADIAZINE (SILVADENE) 1 % cream, Apply pea-sized amount to wound daily., Disp: 50 g, Rfl: 0  Social History   Tobacco Use  Smoking Status Former Smoker  Smokeless Tobacco Never Used    Allergies  Allergen Reactions  . Promethazine Other (See Comments)    Tremors, cold, restlessness.  . Povidone-Iodine Rash   Objective:   Vitals:  09/29/18 0928  BP: 128/76  Pulse: 85  Resp: 16  Temp: 98 F (36.7 C)   There is no height or weight on file to calculate BMI. Constitutional Well developed. Well nourished.  Vascular Foot warm and well perfused. Capillary refill normal to all digits.   Neurologic Normal speech. Oriented to person, place, and time. Epicritic sensation to light touch grossly present bilaterally.  Dermatologic Skin well healed left Ulcer healed left second toe. Right first MPJ ulceration 2 x 2 with granular base hyperkeratotic rim no warmth erythema signs of infection.   Orthopedic: No tenderness to palpation noted about the surgical site. 5 degrees of ankle DF ROM right   Radiographs: None Assessment:   1. Acquired equinus deformity of  right foot   2. Diabetic ulcer of other part of right foot associated with diabetes mellitus due to underlying condition, limited to breakdown of skin ([place])   3. Skin ulcer of right foot, limited to breakdown of skin ([place])   4. Post-operative state   5. 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  S/p foot surgery left -Progressing as expected post-operatively.  -Wound check left next visit.  R 1st MPJ Ulcer -XR with marker taken show indeed pressure point is the metatarsal -Continue PT for Ankle ROM -Wound bed cauterized with silver nitrate. -WBAT in offloading Diabetic insert.   Procedure: Chemical Cauterization of Granulation Tissue Rationale: Cauterize granular wound base to promote healing.  Wound Measurements: 2 cm x 2 cm x 0.1 cm  Instrumentation: Silver nitrate stick x3 Dressing: Dry, sterile, compression dressing. Disposition: Patient tolerated procedure well. Patient to return in 1 week for follow-up.  Return in about 2 weeks (around 10/13/2018) for Wound Care.

## 2018-10-06 ENCOUNTER — Ambulatory Visit (HOSPITAL_COMMUNITY)
Admission: RE | Admit: 2018-10-06 | Discharge: 2018-10-06 | Disposition: A | Discharge status: Home / Self Care | Payer: Medicare Other | Source: Ambulatory Visit | Attending: Family | Admitting: Family

## 2018-10-06 ENCOUNTER — Other Ambulatory Visit: Payer: Self-pay

## 2018-10-06 ENCOUNTER — Ambulatory Visit (INDEPENDENT_AMBULATORY_CARE_PROVIDER_SITE_OTHER): Payer: Medicare Other | Admitting: Vascular Surgery

## 2018-10-06 ENCOUNTER — Encounter: Payer: Self-pay | Admitting: Vascular Surgery

## 2018-10-06 VITALS — BP 139/80 | HR 78 | Temp 97.1°F | Resp 20 | Ht 68.0 in | Wt 177.0 lb

## 2018-10-06 DIAGNOSIS — I739 Peripheral vascular disease, unspecified: Secondary | ICD-10-CM

## 2018-10-06 NOTE — Progress Notes (Signed)
Patient name: John Ortiz MRN: [id number] DOB: [birth year] Sex: male  REASON FOR VISIT: Follow-up tissue loss RLE  HPI: John Ortiz is a 73 y.o. male with history of end-stage renal disease, diabetes, CHF that presents for 3 month follow-up after right lower extremity angiogram with angioplasty of his anterior tibial artery on 04/30/18.  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 Focal AT stenosis treated during angioplasty. Now has a PCP and getting blood sugar controlled.  Also seeing podiatrist in [place] every couple of weeks for local debridement.  No new complaints.  No rest pain in either foot.  No new wounds or tissue loss.  States right leg still feels good overall.  Felt wound has shown signs of healing ongoing with recent debridement.  Past Medical History:  Diagnosis Date  . Anemia in CKD (chronic kidney disease)   . Aneurysm (HCC)    Left arm  . CHF (congestive heart failure) ([place])   . Chronic kidney disease, stage V ([place])   . Coagulation defect ([place])   . Diabetes mellitus type 2, uncomplicated ([place])   . Diarrhea   . Fluid overload   . Hypercalcemia   . Iron deficiency anemia, unspecified   . Non-healing ulcer of foot ([place])    Right foot  . Pain   . Pruritus   . Renal osteodystrophy   . Secondary hyperparathyroidism of renal origin ([place])   . Shortness of breath   . Type 2 diabetes mellitus with hypoglycemia without coma ([place])   . Unspecified protein-calorie malnutrition ([place])     Past Surgical History:  Procedure Laterality Date  . ABDOMINAL AORTOGRAM W/LOWER EXTREMITY N/A 04/30/2018   Procedure: ABDOMINAL AORTOGRAM W/LOWER EXTREMITY;  Surgeon: Marty Heck, MD;  Location: [facility];  Service: Cardiovascular;  Laterality: N/A;  . av fistula transposed/ [place]    . PERIPHERAL VASCULAR BALLOON ANGIOPLASTY Right 04/30/2018   Procedure: PERIPHERAL VASCULAR BALLOON  ANGIOPLASTY;  Surgeon: Marty Heck, MD;  Location: [facility];  Service: Cardiovascular;  Laterality: Right;  Anterior tibial    History reviewed. No pertinent family history.  SOCIAL HISTORY: Social History   Tobacco Use  . Smoking status: Former Research scientist (life sciences)  . Smokeless tobacco: Never Used  Substance Use Topics  . Alcohol use: Never    Frequency: Never    Allergies  Allergen Reactions  . Promethazine Other (See Comments)    Tremors, cold, restlessness.  . Povidone-Iodine Rash    Current Outpatient Medications  Medication Sig Dispense Refill  . aspirin EC 81 MG tablet Take 81 mg by mouth daily.    Marland Kitchen atorvastatin (LIPITOR) 40 MG tablet Take 1 tablet (40 mg total) by mouth daily. 90 tablet 3  . B Complex-C-Folic Acid (DIALYVITE 962) 0.8 MG TABS Take by mouth.    . calcium acetate (PHOSLO) 667 MG capsule Take 2,001 mg by mouth 3 (three) times daily with meals.    . cinacalcet (SENSIPAR) 60 MG tablet Take by mouth.    . clopidogrel (PLAVIX) 75 MG tablet Take 1 tablet (75 mg total) by mouth daily. 30 tablet 11  . Cyanocobalamin (B-12) 500 MCG TABS Take 500 mcg by mouth 3 (three) times daily.     Marland Kitchen FLUoxetine (PROZAC) 20 MG capsule Take 20 mg by mouth daily.    . furosemide (LASIX) 40 MG tablet Take 40 mg by mouth daily.     . insulin  aspart (NOVOLOG FLEXPEN) 100 UNIT/ML FlexPen Inject 7-15 Units into the skin 3 (three) times daily with meals. Per sliding scale    . insulin glargine (LANTUS) 100 UNIT/ML injection Inject 4 Units into the skin 2 (two) times daily.     . mupirocin ointment (BACTROBAN) 2 % Apply topically.    . silver sulfADIAZINE (SILVADENE) 1 % cream Apply pea-sized amount to wound daily. 50 g 0   No current facility-administered medications for this visit.     REVIEW OF SYSTEMS:  [X]  denotes positive finding, [ ]  denotes negative finding Cardiac  Comments:  Chest pain or chest pressure:    Shortness of breath upon exertion:    Short of breath when  lying flat:    Irregular heart rhythm:        Vascular    Pain in calf, thigh, or hip brought on by ambulation:    Pain in feet at night that wakes you up from your sleep:     Blood clot in your veins:    Leg swelling:         Pulmonary    Oxygen at home:    Productive cough:     Wheezing:         Neurologic    Sudden weakness in arms or legs:     Sudden numbness in arms or legs:     Sudden onset of difficulty speaking or slurred speech:    Temporary loss of vision in one eye:     Problems with dizziness:         Gastrointestinal    Blood in stool:     Vomited blood:         Genitourinary    Burning when urinating:     Blood in urine:        Psychiatric    Major depression:         Hematologic    Bleeding problems:    Problems with blood clotting too easily:        Skin    Rashes or ulcers:        Constitutional    Fever or chills:      PHYSICAL EXAM: Vitals:   10/06/18 1226  BP: 139/80  Pulse: 78  Resp: 20  Temp: (!) 97.1 F (36.2 C)  SpO2: 98%  Weight: 177 lb (80.3 kg)  Height: 5\' 8"  (1.727 m)    GENERAL: The patient is a well-nourished male, in no acute distress. The vital signs are documented above. VASCULAR:  Palpable femoral pulses bilaterally DP/PT brisk signals in right foot that appear near triphasic Clean based neuropathic ulcer bottom of right foot at metatarsal head - recently debrided and very clean. Old healed left great toe amp PULMONARY: There is good air exchange bilaterally without wheezing or rales. ABDOMEN: Soft and non-tender with normal pitched bowel sounds.  MUSCULOSKELETAL: There are no major deformities or cyanosis. NEUROLOGIC: No focal weakness or paresthesias are detected. SKIN: There are no ulcers or rashes noted.  DATA:   ABI non-compressible, biphasic, right TP 80+.  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  He has now undergone right lower extremity  angiogram with intervention.  He actually had fairly decent runoff with patent tibial trifurcation and we treated anterior tibial focal stenosis.  On follow-up today his dorsalis pedis and posterior tibial signals are very brisk and sound near triphasic.  He has pulsatile tracings with a toe  pressure greater than 80 that should be adequate for wound healing.  In talking with him he says that the wound shows signs of healing and then undergoes additional debridement with his podiatrist.  I told him I think we would warrant another intervention if the wound shows failure to progress and/or gets worse or he develops new tissue loss.  He otherwise has no claudication or rest pain that would warrant intervention.  I will plan to see back in 6 months but told him to call me if things worsen in the interim and we can get him set up for another arteriogram.  In reviewing his imaging from September of last year he had patent three-vessel runoff that was very brisk into the foot.   Marty Heck, MD [facility] Office: [phone] Pager: [place]

## 2018-10-13 ENCOUNTER — Ambulatory Visit (INDEPENDENT_AMBULATORY_CARE_PROVIDER_SITE_OTHER): Payer: Medicare Other | Admitting: Podiatry

## 2018-10-13 ENCOUNTER — Encounter: Payer: Self-pay | Admitting: Podiatry

## 2018-10-13 VITALS — BP 130/78 | HR 80 | Temp 98.6°F | Resp 16

## 2018-10-13 DIAGNOSIS — L97511 Non-pressure chronic ulcer of other part of right foot limited to breakdown of skin: Secondary | ICD-10-CM

## 2018-10-13 DIAGNOSIS — E08621 Diabetes mellitus due to underlying condition with foot ulcer: Secondary | ICD-10-CM | POA: Diagnosis not present

## 2018-10-13 NOTE — Telephone Encounter (Signed)
Labs reviewed by MD on 1/31. No changes to medical therapy.

## 2018-10-24 NOTE — Progress Notes (Signed)
Subjective:  Patient ID: John Ortiz, male    DOB: 1946/08/10,  MRN: [id number]  Chief Complaint  Patient presents with  . Foot Ulcer    F/U R 1st MPJ ulcer Pt. states," drains every night, don't know if it looks better, but it feels better." Tx: sx shoe, PT, silvadene and bandaiad -w/ bloody drainage -pt denei sN/V/F?Ch    DOS: 09/01/2018 Procedure: Rt Tendoachilles lengthening, debridement of ulcer, application of skin graft substitute, application of TCC. Correction of Left 2nd hammertoe  73 y.o. male returns for post-op check. Hx as above.  Review of Systems: Negative except as noted in the HPI. Denies N/V/F/Ch.  Past Medical History:  Diagnosis Date  . Anemia in CKD (chronic kidney disease)   . Aneurysm (HCC)    Left arm  . CHF (congestive heart failure) ([place])   . Chronic kidney disease, stage V ([place])   . Coagulation defect ([place])   . Diabetes mellitus type 2, uncomplicated ([place])   . Diarrhea   . Fluid overload   . Hypercalcemia   . Iron deficiency anemia, unspecified   . Non-healing ulcer of foot ([place])    Right foot  . Pain   . Pruritus   . Renal osteodystrophy   . Secondary hyperparathyroidism of renal origin ([place])   . Shortness of breath   . Type 2 diabetes mellitus with hypoglycemia without coma ([place])   . Unspecified protein-calorie malnutrition ([place])     Current Outpatient Medications:  .  aspirin EC 81 MG tablet, Take 81 mg by mouth daily., Disp: , Rfl:  .  atorvastatin (LIPITOR) 40 MG tablet, Take 1 tablet (40 mg total) by mouth daily., Disp: 90 tablet, Rfl: 3 .  B Complex-C-Folic Acid (DIALYVITE 371) 0.8 MG TABS, Take by mouth., Disp: , Rfl:  .  calcium acetate (PHOSLO) 667 MG capsule, Take 2,001 mg by mouth 3 (three) times daily with meals., Disp: , Rfl:  .  cinacalcet (SENSIPAR) 60 MG tablet, Take by mouth., Disp: , Rfl:  .  clopidogrel (PLAVIX) 75 MG tablet, Take 1 tablet (75 mg total) by mouth daily., Disp: 30 tablet, Rfl: 11 .  Cyanocobalamin  (B-12) 500 MCG TABS, Take 500 mcg by mouth 3 (three) times daily. , Disp: , Rfl:  .  FLUoxetine (PROZAC) 20 MG capsule, Take 20 mg by mouth daily., Disp: , Rfl:  .  furosemide (LASIX) 40 MG tablet, Take 40 mg by mouth daily. , Disp: , Rfl:  .  insulin aspart (NOVOLOG FLEXPEN) 100 UNIT/ML FlexPen, Inject 7-15 Units into the skin 3 (three) times daily with meals. Per sliding scale, Disp: , Rfl:  .  insulin glargine (LANTUS) 100 UNIT/ML injection, Inject 4 Units into the skin 2 (two) times daily. , Disp: , Rfl:  .  mupirocin ointment (BACTROBAN) 2 %, Apply topically., Disp: , Rfl:  .  silver sulfADIAZINE (SILVADENE) 1 % cream, Apply pea-sized amount to wound daily., Disp: 50 g, Rfl: 0  Social History   Tobacco Use  Smoking Status Former Smoker  Smokeless Tobacco Never Used    Allergies  Allergen Reactions  . Promethazine Other (See Comments)    Tremors, cold, restlessness.  . Povidone-Iodine Rash   Objective:   Vitals:   10/13/18 1211  BP: 130/78  Pulse: 80  Resp: 16  Temp: 98.6 F (37 C)   There is no height or weight on file to calculate BMI. Constitutional Well developed. Well nourished.  Vascular Foot warm and well perfused. Capillary refill  normal to all digits.   Neurologic Normal speech. Oriented to person, place, and time. Epicritic sensation to light touch grossly present bilaterally.  Dermatologic Skin well healed left Ulcer healed left second toe. Right first MPJ ulceration 2 x 2 with granular base hyperkeratotic rim no warmth erythema signs of infection.   Orthopedic: No tenderness to palpation noted about the surgical site. 5 degrees of ankle DF ROM right   Radiographs: None Assessment:   1. Diabetic ulcer of other part of right foot associated with diabetes mellitus due to underlying condition, limited to breakdown of skin [facility])    Plan:  Patient was evaluated and treated and all questions answered.  S/p foot surgery left -Progressing as expected  post-operatively.  -Wound check left next visit.  R 1st MPJ Ulcer -Continue PT for Ankle ROM -Wound bed cauterized with silver nitrate. -WBAT in offloading Diabetic insert.   Procedure: Chemical Cauterization of Granulation Tissue Rationale: Cauterize granular wound base to promote healing.  Wound Measurements: 1.5 cm x 1.5 cm x 0.1 cm  Instrumentation: Silver nitrate stick x2 Dressing: Dry, sterile, compression dressing. Disposition: Patient tolerated procedure well. Patient to return in 1 week for follow-up.   No follow-ups on file.

## 2018-10-27 ENCOUNTER — Encounter: Payer: Self-pay | Admitting: Podiatry

## 2018-10-27 ENCOUNTER — Ambulatory Visit (INDEPENDENT_AMBULATORY_CARE_PROVIDER_SITE_OTHER): Payer: Medicare Other | Admitting: Podiatry

## 2018-10-27 VITALS — BP 154/79 | HR 60 | Temp 98.3°F | Resp 16

## 2018-10-27 DIAGNOSIS — S99921A Unspecified injury of right foot, initial encounter: Secondary | ICD-10-CM

## 2018-10-27 DIAGNOSIS — L97511 Non-pressure chronic ulcer of other part of right foot limited to breakdown of skin: Secondary | ICD-10-CM

## 2018-10-27 DIAGNOSIS — E08621 Diabetes mellitus due to underlying condition with foot ulcer: Secondary | ICD-10-CM

## 2018-10-27 DIAGNOSIS — M216X1 Other acquired deformities of right foot: Secondary | ICD-10-CM

## 2018-10-27 NOTE — Progress Notes (Signed)
Subjective:  Patient ID: John Ortiz, male    DOB: [birth year],  MRN: [id number]  Chief Complaint  Patient presents with  . Foot Ulcer    F?U R 1st MPJ ulcer Pt.s tates," wife says it looks better and getting smaller." tx: silver nitrate., PT, and diabetic inserts -w. clear drainage   . new lesion    R lateral foot lesion x Today; 3/10 tender/soreness Tx: none -w/ redness, and swelling -pt denies N/V/F/Ch    DOS: 09/01/2018 Procedure: Rt Tendoachilles lengthening, debridement of ulcer, application of skin graft substitute, application of TCC. Correction of Left 2nd hammertoe  73 y.o. male returns for post-op check. Hx as above. New ulcer thinks it was from doing too much activity in his DM shoe.  Review of Systems: Negative except as noted in the HPI. Denies N/V/F/Ch.  Past Medical History:  Diagnosis Date  . Anemia in CKD (chronic kidney disease)   . Aneurysm (HCC)    Left arm  . CHF (congestive heart failure) ([place])   . Chronic kidney disease, stage V ([place])   . Coagulation defect ([place])   . Diabetes mellitus type 2, uncomplicated ([place])   . Diarrhea   . Fluid overload   . Hypercalcemia   . Iron deficiency anemia, unspecified   . Non-healing ulcer of foot ([place])    Right foot  . Pain   . Pruritus   . Renal osteodystrophy   . Secondary hyperparathyroidism of renal origin ([place])   . Shortness of breath   . Type 2 diabetes mellitus with hypoglycemia without coma ([place])   . Unspecified protein-calorie malnutrition ([place])     Current Outpatient Medications:  .  aspirin EC 81 MG tablet, Take 81 mg by mouth daily., Disp: , Rfl:  .  atorvastatin (LIPITOR) 40 MG tablet, Take 1 tablet (40 mg total) by mouth daily., Disp: 90 tablet, Rfl: 3 .  B Complex-C-Folic Acid (DIALYVITE 427) 0.8 MG TABS, Take by mouth., Disp: , Rfl:  .  calcium acetate (PHOSLO) 667 MG capsule, Take 2,001 mg by mouth 3 (three) times daily with meals., Disp: , Rfl:  .  cinacalcet (SENSIPAR) 60 MG tablet, Take  by mouth., Disp: , Rfl:  .  clopidogrel (PLAVIX) 75 MG tablet, Take 1 tablet (75 mg total) by mouth daily., Disp: 30 tablet, Rfl: 11 .  Cyanocobalamin (B-12) 500 MCG TABS, Take 500 mcg by mouth 3 (three) times daily. , Disp: , Rfl:  .  FLUoxetine (PROZAC) 20 MG capsule, Take 20 mg by mouth daily., Disp: , Rfl:  .  furosemide (LASIX) 40 MG tablet, Take 40 mg by mouth daily. , Disp: , Rfl:  .  insulin aspart (NOVOLOG FLEXPEN) 100 UNIT/ML FlexPen, Inject 7-15 Units into the skin 3 (three) times daily with meals. Per sliding scale, Disp: , Rfl:  .  insulin glargine (LANTUS) 100 UNIT/ML injection, Inject 4 Units into the skin 2 (two) times daily. , Disp: , Rfl:  .  mupirocin ointment (BACTROBAN) 2 %, Apply topically., Disp: , Rfl:  .  silver sulfADIAZINE (SILVADENE) 1 % cream, Apply pea-sized amount to wound daily., Disp: 50 g, Rfl: 0  Social History   Tobacco Use  Smoking Status Former Smoker  Smokeless Tobacco Never Used    Allergies  Allergen Reactions  . Promethazine Other (See Comments)    Tremors, cold, restlessness.  . Povidone-Iodine Rash   Objective:   Vitals:   10/27/18 0925  BP: (!) 154/79  Pulse: 60  Resp: 16  Temp: 98.3 F (36.8 C)   There is no height or weight on file to calculate BMI. Constitutional Well developed. Well nourished.  Vascular Foot warm and well perfused. Capillary refill normal to all digits.   Neurologic Normal speech. Oriented to person, place, and time. Epicritic sensation to light touch grossly present bilaterally.  Dermatologic Skin well healed left Ulcer healed left second toe. Right first MPJ ulceratio 1.5x1 with granular base hyperkeratotic rim no warmth erythema signs of infection.   Orthopedic: No tenderness to palpation noted about the surgical site. 5 degrees of ankle DF ROM right   Radiographs: None Assessment:   1. Diabetic ulcer of other part of right foot associated with diabetes mellitus due to underlying condition, limited  to breakdown of skin ([place])   2. Acquired equinus deformity of right foot   3. Injury of right foot, initial encounter    Plan:  Patient was evaluated and treated and all questions answered.  R 1st MPJ Ulcer -Wound cauterized with silver nitrate. Improving   Procedure: Chemical Cauterization of Granulation Tissue Rationale: Cauterize granular wound base to promote healing.  Wound Measurements: 1.5 cm x 1 cm x 0.1 cm  Instrumentation: Silver nitrate stick x2 Dressing: Dry, sterile, compression dressing. Disposition: Patient tolerated procedure well. Patient to return in 1 week for follow-up.  Ulcer Lateral Foot -Dressed with Betadine -Discussed use of Offloading boot until healed.    Return in about 2 weeks (around 11/10/2018).

## 2018-11-02 ENCOUNTER — Telehealth: Payer: Self-pay | Admitting: *Deleted

## 2018-11-02 NOTE — Telephone Encounter (Addendum)
"  I got your message here at the house.  I'm just returning your message.  Thank you very much and you have a great day."

## 2018-11-06 ENCOUNTER — Encounter: Payer: Self-pay | Admitting: *Deleted

## 2018-11-06 ENCOUNTER — Encounter: Payer: Self-pay | Admitting: Family

## 2018-11-06 ENCOUNTER — Other Ambulatory Visit: Payer: Self-pay

## 2018-11-06 ENCOUNTER — Ambulatory Visit (INDEPENDENT_AMBULATORY_CARE_PROVIDER_SITE_OTHER): Payer: Medicare Other | Admitting: Family

## 2018-11-06 VITALS — BP 122/68 | HR 78 | Temp 97.6°F | Resp 18 | Ht 68.0 in | Wt 181.0 lb

## 2018-11-06 DIAGNOSIS — N186 End stage renal disease: Secondary | ICD-10-CM | POA: Diagnosis not present

## 2018-11-06 DIAGNOSIS — Z992 Dependence on renal dialysis: Secondary | ICD-10-CM | POA: Diagnosis not present

## 2018-11-06 DIAGNOSIS — T82898A Other specified complication of vascular prosthetic devices, implants and grafts, initial encounter: Secondary | ICD-10-CM | POA: Diagnosis not present

## 2018-11-06 DIAGNOSIS — I779 Disorder of arteries and arterioles, unspecified: Secondary | ICD-10-CM | POA: Diagnosis not present

## 2018-11-06 NOTE — Progress Notes (Signed)
Cc: referred by Dr. 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 ([birth year]) male with history of end-stage renal disease, diabetes, CHF that presented on 10-06-18 to Dr. Carlis Abbott for 3 month follow-up after right lower extremity angiogram with angioplasty of his anterior tibial artery on 04/30/18.    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 Focal AT stenosis treated during angioplasty. Now has a PCP and getting blood sugar controlled.  Also seeing podiatrist, Dr. March Rummage, in [place] every couple of weeks for local debridement.    No rest pain in either foot.  No new wounds or tissue loss.  States right leg still feels good overall.    Dr. Carlis Abbott last evaluated pt on 10-06-18. 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  He underwent right lower extremity angiogram with intervention.  He actually had fairly decent runoff with patent tibial trifurcation and Dr. Carlis Abbott treated anterior tibial focal stenosis.  On follow-up that day his dorsalis pedis and posterior tibial signals were very brisk and sounded near triphasic.  He had pulsatile tracings with a toe pressure greater than 80 that should be adequate for wound healing.  In talking with him that day he said that the wound showed signs of healing and then undergoes additional debridement with his podiatrist.  Dr. Carlis Abbott told him he thought we would warrant another intervention if the wound shows failure to progress and/or gets worse or he develops new tissue loss.  He otherwise had no claudication or rest pain that would warrant intervention. Dr. Carlis Abbott advised follow up in 6 months but told him to call if things worsen in the interim and we can get him set up for another arteriogram.  In reviewing his imaging from September of last year he had patent three-vessel runoff that was very brisk  into the foot.  Pt returns today at the request of Dr. 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 He denies any steal type sx's in left UE. He states that he is able to do 4 hours for HD on M-W-F.  He states that some of the staff have problems accessing his AVF, some have no problems.  He states that this is the only AVF that he has had, had it for 11 years, states he has had 2 previous revisions of what sounds like stenoses of his AVF.    Past Medical History:  Diagnosis Date   Anemia in CKD (chronic kidney disease)    Aneurysm (HCC)    Left arm   CHF (congestive heart failure) (HCC)    Chronic kidney disease, stage V (HCC)    Coagulation defect ([place])    Diabetes mellitus type 2, uncomplicated (HCC)    Diarrhea    Fluid overload    Hypercalcemia    Iron deficiency anemia, unspecified    Non-healing ulcer of foot (HCC)    Right foot   Pain    Pruritus    Renal osteodystrophy    Secondary hyperparathyroidism of renal origin ([place])    Shortness of breath    Type 2 diabetes mellitus with hypoglycemia without coma (HCC)    Unspecified protein-calorie malnutrition (HCC)     Social History Social History   Tobacco Use   Smoking status: Former Smoker   Smokeless tobacco: Never Used  Substance Use  Topics   Alcohol use: Never    Frequency: Never   Drug use: Never    Family History History reviewed. No pertinent family history.  Surgical History Past Surgical History:  Procedure Laterality Date   ABDOMINAL AORTOGRAM W/LOWER EXTREMITY N/A 04/30/2018   Procedure: ABDOMINAL AORTOGRAM W/LOWER EXTREMITY;  Surgeon: Marty Heck, MD;  Location: [facility];  Service: Cardiovascular;  Laterality: N/A;   av fistula transposed/ Above Elbow     PERIPHERAL VASCULAR BALLOON ANGIOPLASTY Right 04/30/2018   Procedure: PERIPHERAL VASCULAR BALLOON ANGIOPLASTY;  Surgeon: Marty Heck, MD;  Location: [facility];  Service:  Cardiovascular;  Laterality: Right;  Anterior tibial    Allergies  Allergen Reactions   Promethazine Other (See Comments)    Tremors, cold, restlessness.   Povidone-Iodine Rash    Current Outpatient Medications  Medication Sig Dispense Refill   aspirin EC 81 MG tablet Take 81 mg by mouth daily.     atorvastatin (LIPITOR) 40 MG tablet Take 1 tablet (40 mg total) by mouth daily. 90 tablet 3   B Complex-C-Folic Acid (DIALYVITE 222) 0.8 MG TABS Take by mouth.     calcium acetate (PHOSLO) 667 MG capsule Take 2,001 mg by mouth 3 (three) times daily with meals.     clopidogrel (PLAVIX) 75 MG tablet Take 1 tablet (75 mg total) by mouth daily. 30 tablet 11   Cyanocobalamin (B-12) 500 MCG TABS Take 500 mcg by mouth 3 (three) times daily.      FLUoxetine (PROZAC) 20 MG capsule Take 20 mg by mouth daily.     furosemide (LASIX) 40 MG tablet Take 40 mg by mouth daily.      insulin aspart (NOVOLOG FLEXPEN) 100 UNIT/ML FlexPen Inject 7-15 Units into the skin 3 (three) times daily with meals. Per sliding scale     insulin glargine (LANTUS) 100 UNIT/ML injection Inject 4 Units into the skin 2 (two) times daily.      mupirocin ointment (BACTROBAN) 2 % Apply topically.     silver sulfADIAZINE (SILVADENE) 1 % cream Apply pea-sized amount to wound daily. 50 g 0   cinacalcet (SENSIPAR) 60 MG tablet Take by mouth.     No current facility-administered medications for this visit.      REVIEW OF SYSTEMS: see HPI for pertinent positives and negatives    PHYSICAL EXAMINATION:  Vitals:   11/06/18 1355  BP: 122/68  Pulse: 78  Resp: 18  Temp: 97.6 F (36.4 C)  TempSrc: Oral  SpO2: 95%  Weight: 181 lb (82.1 kg)  Height: 5\' 8"  (1.727 m)   Body mass index is 27.52 kg/m.  General: The patient appears his stated age.  Pt walks with a cane.  HEENT:  No gross abnormalities, long mustache Pulmonary: Respirations are non-labored, CTAB Abdomen: Soft and non-tender with normal  BS. Musculoskeletal: There are no major deformities.   Neurologic: No focal weakness or paresthesias are detected. Skin: There are no ulcer or rashes noted. Feet are pink and warm with brisk capillary refill. Pedal pulses are not palpable, bilateral femoral pulses are 2+ palpable. Psychiatric: The patient has normal affect. Cardiovascular: There is a regular rate and rhythm without significant murmur appreciated.  Bilateral radial pulses are 2+ palpable. Left upper arm AVF with palpable thrill, audible bruit, is aneurysmal, no visible pulsations, no thinning of skin at AVF.    Medical Decision Making  John Ortiz is a 73 y.o. male who presents with moderately aneurysmal left upper arm AVF.  Pt denies steal sx's in left UE, there is no thinning of skin over AVF, pt denies any bleeding issues, states he dialyzes for 4 hours per session.   PAOD: no ulcers, no signs of ischemia in his feet or legs; pt denies claudication.    Dr. Donzetta Matters spoke with and examined pt.  Will schedule for revision of AVF access and placement of TDC.    Clemon Chambers, RN, MSN, FNP-C [facility] Office: [phone]  11/06/2018, 2:23 PM  Clinic MD: Donzetta Matters

## 2018-11-09 ENCOUNTER — Other Ambulatory Visit: Payer: Self-pay | Admitting: *Deleted

## 2018-11-10 ENCOUNTER — Ambulatory Visit: Payer: Medicare Other | Admitting: Podiatry

## 2018-11-16 ENCOUNTER — Ambulatory Visit (INDEPENDENT_AMBULATORY_CARE_PROVIDER_SITE_OTHER): Payer: Medicare Other | Admitting: Podiatry

## 2018-11-16 ENCOUNTER — Encounter: Payer: Self-pay | Admitting: Podiatry

## 2018-11-16 ENCOUNTER — Other Ambulatory Visit: Payer: Self-pay

## 2018-11-16 VITALS — BP 114/72 | HR 69 | Temp 97.9°F | Resp 16

## 2018-11-16 DIAGNOSIS — L928 Other granulomatous disorders of the skin and subcutaneous tissue: Secondary | ICD-10-CM | POA: Diagnosis not present

## 2018-11-16 DIAGNOSIS — E08621 Diabetes mellitus due to underlying condition with foot ulcer: Secondary | ICD-10-CM | POA: Diagnosis not present

## 2018-11-16 DIAGNOSIS — L97511 Non-pressure chronic ulcer of other part of right foot limited to breakdown of skin: Secondary | ICD-10-CM

## 2018-11-17 ENCOUNTER — Ambulatory Visit: Payer: Medicare Other | Admitting: Podiatry

## 2018-11-19 ENCOUNTER — Ambulatory Visit (HOSPITAL_COMMUNITY): Admission: RE | Admit: 2018-11-19 | Payer: Medicare Other | Source: Home / Self Care | Admitting: Vascular Surgery

## 2018-11-19 ENCOUNTER — Encounter (HOSPITAL_COMMUNITY): Admission: RE | Payer: Self-pay | Source: Home / Self Care

## 2018-11-19 SURGERY — REVISON OF ARTERIOVENOUS FISTULA
Anesthesia: Choice | Laterality: Left

## 2018-11-23 NOTE — Progress Notes (Signed)
Subjective:  Patient ID: John Ortiz, male    DOB: August 16, 1946,  MRN: [id number]  Chief Complaint  Patient presents with  . Foot Ulcer    F/U Rt foot ulce rPT.s tates," the ulcer on my rt foot is getting smaller. Pain once in a while; 2/3." Tx: silvadene and bandiad -pt deneis N/V/F/Ch    DOS: 09/01/2018 Procedure: Rt [place] lengthening, debridement of ulcer, application of skin graft substitute, application of TCC. Correction of Left 2nd hammertoe  73 y.o. male returns for post-op check. Hx as above.  States the wound is doing much better.  Review of Systems: Negative except as noted in the HPI. Denies N/V/F/Ch.  Past Medical History:  Diagnosis Date  . Anemia in CKD (chronic kidney disease)   . Aneurysm (HCC)    Left arm  . CHF (congestive heart failure) ([place])   . Chronic kidney disease, stage V ([place])   . Coagulation defect ([place])   . Diabetes mellitus type 2, uncomplicated ([place])   . Diarrhea   . Fluid overload   . Hypercalcemia   . Iron deficiency anemia, unspecified   . Non-healing ulcer of foot ([place])    Right foot  . Pain   . Pruritus   . Renal osteodystrophy   . Secondary hyperparathyroidism of renal origin ([place])   . Shortness of breath   . Type 2 diabetes mellitus with hypoglycemia without coma ([place])   . Unspecified protein-calorie malnutrition ([place])     Current Outpatient Medications:  .  aspirin EC 81 MG tablet, Take 81 mg by mouth at bedtime. , Disp: , Rfl:  .  atorvastatin (LIPITOR) 40 MG tablet, Take 1 tablet (40 mg total) by mouth daily., Disp: 90 tablet, Rfl: 3 .  B Complex-C-Folic Acid (DIALYVITE 947) 0.8 MG TABS, Take 1 tablet by mouth at bedtime. , Disp: , Rfl:  .  calcium acetate (PHOSLO) 667 MG capsule, Take 2,001 mg by mouth 3 (three) times daily with meals., Disp: , Rfl:  .  clopidogrel (PLAVIX) 75 MG tablet, Take 1 tablet (75 mg total) by mouth daily., Disp: 30 tablet, Rfl: 11 .  Cyanocobalamin (B-12) 500 MCG TABS, Take 500 mcg by mouth  daily. , Disp: , Rfl:  .  FLUoxetine (PROZAC) 20 MG capsule, Take 20 mg by mouth daily., Disp: , Rfl:  .  furosemide (LASIX) 40 MG tablet, Take 40 mg by mouth daily with lunch. , Disp: , Rfl:  .  insulin aspart (NOVOLOG FLEXPEN) 100 UNIT/ML FlexPen, Inject 7-15 Units into the skin 3 (three) times daily with meals. Sliding Scale Insulin, Disp: , Rfl:  .  insulin glargine (LANTUS) 100 UNIT/ML injection, Inject 8 Units into the skin 2 (two) times daily. , Disp: , Rfl:  .  silver sulfADIAZINE (SILVADENE) 1 % cream, Apply pea-sized amount to wound daily. (Patient taking differently: Apply 1 application topically daily. Apply pea-sized amount to wound daily.), Disp: 50 g, Rfl: 0 .  vitamin B-12 (CYANOCOBALAMIN) 500 MCG tablet, Take 500 mcg by mouth daily., Disp: , Rfl:   Social History   Tobacco Use  Smoking Status Former Smoker  Smokeless Tobacco Never Used    Allergies  Allergen Reactions  . Promethazine Other (See Comments)    Tremors, cold, restlessness.  . Povidone-Iodine Rash   Objective:   Vitals:   11/16/18 1623  BP: 114/72  Pulse: 69  Resp: 16  Temp: 97.9 F (36.6 C)   There is no height or weight on file to calculate BMI.  Constitutional Well developed. Well nourished.  Vascular Foot warm and well perfused. Capillary refill normal to all digits.   Neurologic Normal speech. Oriented to person, place, and time. Epicritic sensation to light touch grossly present bilaterally.  Dermatologic Skin well healed left Ulcer healed left second toe. Right first MPJ ulceration one by one with granular base slight hyperkeratotic rim no warmth erythema signs of acute infection   Orthopedic: No tenderness to palpation noted about the surgical site. 5 degrees of ankle DF ROM right   Radiographs: None Assessment:   1. Other granulomatous disorders of the skin and subcutaneous tissue   2. Diabetic ulcer of other part of right foot associated with diabetes mellitus due to underlying  condition, limited to breakdown of skin [facility])    Plan:  Patient was evaluated and treated and all questions answered.  R 1st MPJ Ulcer -Wound cauterized with silver nitrate. Improving   Procedure: Chemical Cauterization of Granulation Tissue Rationale: Cauterize granular wound base to promote healing.  Wound Measurements: 1 cm x 1 cm x 0.1 cm  Instrumentation: Silver nitrate stick x2 Dressing: Dry, sterile, compression dressing. Disposition: Patient tolerated procedure well. Patient to return in 1 week for follow-up.  Return in about 1 month (around 12/17/2018).

## 2018-11-30 ENCOUNTER — Other Ambulatory Visit: Payer: Self-pay | Admitting: Vascular Surgery

## 2018-12-08 ENCOUNTER — Other Ambulatory Visit: Payer: Self-pay

## 2018-12-08 ENCOUNTER — Other Ambulatory Visit: Payer: Self-pay | Admitting: *Deleted

## 2018-12-08 ENCOUNTER — Encounter (HOSPITAL_COMMUNITY): Payer: Self-pay | Admitting: *Deleted

## 2018-12-08 NOTE — Progress Notes (Addendum)
Mr John Ortiz denies chest pain, shortness of breath. Patient had CABG in 2008.  He was by a cardiologist in [facility], Dr John Ortiz.  Patient has not seen a cardiologist since he moved to [place] in 2018. Records are in [place]. Mr John Ortiz has type II diabetes, he reports that CBGs run 60 - [place].  Patient does not see a Endocrinologist .  PCP is Dr Belenda Cruise Kristine Royal in [place]. I instructed patien tto take 4 units of Lantus  Wednesday hs.  If CBG > 220 on Thursday am take 1/2/of sliding scale dose. I instructed patient to check CBG after awaking and every 2 hours until arrival  to the hospital.  I Instructed patient if CBG is less than 70 to take 4 Glucose Tablets . Recheck CBG in 15 minutes then call pre- op desk at [phone] for further instructions. If scheduled to receive Insulin, do not take Insulin.  Mr John Ortiz was to stop Plavix after dose 12/12/2018- he has not stopped it , took it today at 1200.  I left a voice message for John Ortiz at Dr Ainsley Spinner office.  Patient denies that he or her family has experienced any of the following: Cough Fever >100.4 Runny Nose Sore Throat Difficulty breathing/ shortness of breath Travel in past 14 days- none.  I will ask anesthesiology PA- C to review.

## 2018-12-09 NOTE — Progress Notes (Signed)
Anesthesia Chart Review: SAME DAY WORKUP  Case:  [id number] Date/Time:  12/10/18 0950   Procedure:  REVISION OF ARTERIOVENOUS FISTULA LEFT ARM (Left )   Anesthesia type:  Choice   Pre-op diagnosis:  complication of fistula   Location:  [facility] OR ROOM 10 / [facility]   Surgeon:  Marty Heck, MD       DISCUSSION: 73 yo male former smoker. Pertinent hx includes ESRD on HD, CAD s/p CABG 2008, Afib, Aortic stenosis s/p bioprosthetic valve 2016 (now with mod-severe AS by Echo 2019), IDDM, Anemia, PVD s/p angioplasty of anterior tibial arteryon 04/30/18.  Pt follows with Dr. Hamilton Capri at [facility] for history of CABG 2008 (No MI at the time, identified during workup for DOE) and history of AVR 2016. CABG and AVR both done in [place], notes from [employer] available in care everywhere. He was seen by Dr. Hamilton Capri 06/11/2018 for initial consult. His note does mention history of CABG, but not AVR. He noted a systolic murmur and echo was ordered to assess for AS. Echo 06/16/18 showed EF 66% with mod-severe AS, mean gradient 33 mmHg, AV Area 0.76 cm2. He has not been seen in followup by cardiology since this result.   Of note, pt had an echo 03/03/2018 as part of transplant eval and at that time mean AV gradient was 28 mmHg and AV area 1.3 cm2. He was turned down for transplant in part due to his AS.   Discussed with Dr. Linna Caprice. Pt will need DOS eval. If asymptomatic can proceed as planned, can consider regional block.. Per PAT nursing preop phone call the pt denies chest pain or SOB.   VS: There were no vitals taken for this visit.  PROVIDERS: Clinic, Grant Ruts, MD is Nephrologist  Almira Coaster, MD is Cardiologist  LABS: Will need DOS labs  Labs Reviewed - No data to display   EKG: 07/22/2018: Afib, Vent rate 77, LAD, nonspecific intraventricular block, cannot rule out septal infarct, age undetermined, T wave abnormality, consider lateral ischemia.   CV: Echo 06/23/2018  (care everywhere): Mitral Valve Mild mitral regurgitation. Severe mitral annular calcification. Aortic Valve Mean transaortic gradient (Doppler) 35 No aortic regurgitation. heavy aortic valve calcification moderate to severe aortic stenosis Tricuspid Valve Tricuspid valve is structurally normal. Mild tricuspid regurgitation. RVSP 39 mm Hg. Pulmonic Valve Normal pulmonic valve structure and mobility. Left Atrium Severely dilated left atrium. Left atrial volume index of 51 ml per meters squared BSA. Left Ventricle Normal left ventricular systolic function Ejection fraction is 66% Moderate left ventricular hypertrophy Right Atrium Normal right atrium. Right Ventricle Normal right ventricle structure and function. Pericardial Effusion No evidence of pericardial effusion. Pleural Effusion No evidence of pleural effusion. Miscellaneous The aortic root diameter is within normal limits. The IVC is dilated but collapses with inspiration.      Doppler Measurements & Calculations AV Peak Velocity: 379 cm/s; AV Mean Gradient: 33 mmHg; AV VTI: 86.1 cm; AV Area (Continuity):0.76 cm2  Echo 03/03/2018 (care everywhere): SUMMARY The left ventricular size is normal.  Left ventricular systolic function is low normal. LV ejection fraction = 50-55%. The left ventricular wall motion is normal.  Left ventricular filling pattern is indeterminate. The right ventricle is normal in size and function. The left atrium is mildly dilated. Diffuse calcification of the aortic valve. There is moderate aortic stenosis. Aortic valve mean pressure gradient is 28 mmHg. The peak aortic valve velocity 354 cm/s. There is mild mitral regurgitation. IVC size was  mildly dilated. There is no pericardial effusion. There is no comparison study available.  Past Medical History:  Diagnosis Date  . Anemia in CKD (chronic kidney disease)   . Aneurysm (HCC)    Left arm  . Arthritis   . CHF (congestive  heart failure) ([place])   . Chronic kidney disease, stage V ([place])    MWF- [place]  . Coagulation defect ([place])   . Coronary artery disease   . Diabetes mellitus type 2, uncomplicated ([place])   . Diarrhea   . Fluid overload   . GERD (gastroesophageal reflux disease)   . Heart murmur   . Hypercalcemia   . Iron deficiency anemia, unspecified   . Non-healing ulcer of foot ([place])    Right foot  . Pain   . Pruritus   . Renal osteodystrophy   . Secondary hyperparathyroidism of renal origin ([place])   . Shortness of breath   . Type 2 diabetes mellitus with hypoglycemia without coma ([place])   . Unspecified protein-calorie malnutrition ([place])     Past Surgical History:  Procedure Laterality Date  . ABDOMINAL AORTOGRAM W/LOWER EXTREMITY N/A 04/30/2018   Procedure: ABDOMINAL AORTOGRAM W/LOWER EXTREMITY;  Surgeon: Marty Heck, MD;  Location: [facility];  Service: Cardiovascular;  Laterality: N/A;  . AORTIC VALVE REPLACEMENT  2016  . av fistula transposed/ Above Elbow    . COLONOSCOPY    . CORONARY ANGIOPLASTY  2017   stent   . CORONARY ARTERY BYPASS GRAFT  2008  . FOOT SURGERY Right    arch repair, and wound repair  . HAMMER TOE SURGERY Left   . PERIPHERAL VASCULAR BALLOON ANGIOPLASTY Right 04/30/2018   Procedure: PERIPHERAL VASCULAR BALLOON ANGIOPLASTY;  Surgeon: Marty Heck, MD;  Location: [facility];  Service: Cardiovascular;  Laterality: Right;  Anterior tibial    MEDICATIONS: No current facility-administered medications for this encounter.    Marland Kitchen acetaminophen (TYLENOL) 500 MG tablet  . aspirin EC 81 MG tablet  . B Complex-C-Folic Acid (DIALYVITE 865) 0.8 MG TABS  . calcium acetate (PHOSLO) 667 MG capsule  . clopidogrel (PLAVIX) 75 MG tablet  . FLUoxetine (PROZAC) 20 MG capsule  . furosemide (LASIX) 80 MG tablet  . glucose 4 GM chewable tablet  . insulin aspart (NOVOLOG FLEXPEN) 100 UNIT/ML FlexPen  . insulin glargine (LANTUS) 100 UNIT/ML injection  .  silver sulfADIAZINE (SILVADENE) 1 % cream  . simvastatin (ZOCOR) 40 MG tablet  . vitamin B-12 (CYANOCOBALAMIN) 500 MCG tablet  . atorvastatin (LIPITOR) 40 MG tablet    Wynonia Musty [facility] Short Stay Center/Anesthesiology Phone [phone] 12/09/2018 11:08 AM

## 2018-12-10 ENCOUNTER — Ambulatory Visit (HOSPITAL_COMMUNITY)
Admission: RE | Admit: 2018-12-10 | Discharge: 2018-12-10 | Disposition: A | Discharge status: Home / Self Care | Payer: Medicare Other | Attending: Vascular Surgery | Admitting: Vascular Surgery

## 2018-12-10 ENCOUNTER — Other Ambulatory Visit: Payer: Self-pay

## 2018-12-10 ENCOUNTER — Ambulatory Visit (HOSPITAL_COMMUNITY): Payer: Medicare Other | Admitting: Physician Assistant

## 2018-12-10 ENCOUNTER — Ambulatory Visit (HOSPITAL_COMMUNITY): Payer: Medicare Other

## 2018-12-10 ENCOUNTER — Encounter (HOSPITAL_COMMUNITY)
Admission: RE | Disposition: A | Discharge status: Home / Self Care | Payer: Self-pay | Source: Home / Self Care | Attending: Vascular Surgery

## 2018-12-10 ENCOUNTER — Encounter (HOSPITAL_COMMUNITY): Payer: Self-pay

## 2018-12-10 DIAGNOSIS — N250 Renal osteodystrophy: Secondary | ICD-10-CM | POA: Insufficient documentation

## 2018-12-10 DIAGNOSIS — Y832 Surgical operation with anastomosis, bypass or graft as the cause of abnormal reaction of the patient, or of later complication, without mention of misadventure at the time of the procedure: Secondary | ICD-10-CM | POA: Insufficient documentation

## 2018-12-10 DIAGNOSIS — Z992 Dependence on renal dialysis: Secondary | ICD-10-CM

## 2018-12-10 DIAGNOSIS — I700 Atherosclerosis of aorta: Secondary | ICD-10-CM | POA: Diagnosis not present

## 2018-12-10 DIAGNOSIS — T82898A Other specified complication of vascular prosthetic devices, implants and grafts, initial encounter: Secondary | ICD-10-CM | POA: Insufficient documentation

## 2018-12-10 DIAGNOSIS — D631 Anemia in chronic kidney disease: Secondary | ICD-10-CM | POA: Insufficient documentation

## 2018-12-10 DIAGNOSIS — Z9109 Other allergy status, other than to drugs and biological substances: Secondary | ICD-10-CM | POA: Diagnosis not present

## 2018-12-10 DIAGNOSIS — Z7902 Long term (current) use of antithrombotics/antiplatelets: Secondary | ICD-10-CM | POA: Insufficient documentation

## 2018-12-10 DIAGNOSIS — Z419 Encounter for procedure for purposes other than remedying health state, unspecified: Secondary | ICD-10-CM

## 2018-12-10 DIAGNOSIS — Z952 Presence of prosthetic heart valve: Secondary | ICD-10-CM | POA: Insufficient documentation

## 2018-12-10 DIAGNOSIS — Z87891 Personal history of nicotine dependence: Secondary | ICD-10-CM | POA: Insufficient documentation

## 2018-12-10 DIAGNOSIS — Z79899 Other long term (current) drug therapy: Secondary | ICD-10-CM | POA: Insufficient documentation

## 2018-12-10 DIAGNOSIS — Z794 Long term (current) use of insulin: Secondary | ICD-10-CM | POA: Diagnosis not present

## 2018-12-10 DIAGNOSIS — Z9862 Peripheral vascular angioplasty status: Secondary | ICD-10-CM | POA: Diagnosis not present

## 2018-12-10 DIAGNOSIS — N2581 Secondary hyperparathyroidism of renal origin: Secondary | ICD-10-CM | POA: Insufficient documentation

## 2018-12-10 DIAGNOSIS — M1990 Unspecified osteoarthritis, unspecified site: Secondary | ICD-10-CM | POA: Diagnosis not present

## 2018-12-10 DIAGNOSIS — I2510 Atherosclerotic heart disease of native coronary artery without angina pectoris: Secondary | ICD-10-CM | POA: Insufficient documentation

## 2018-12-10 DIAGNOSIS — Z7982 Long term (current) use of aspirin: Secondary | ICD-10-CM | POA: Diagnosis not present

## 2018-12-10 DIAGNOSIS — Z951 Presence of aortocoronary bypass graft: Secondary | ICD-10-CM | POA: Insufficient documentation

## 2018-12-10 DIAGNOSIS — E1122 Type 2 diabetes mellitus with diabetic chronic kidney disease: Secondary | ICD-10-CM | POA: Diagnosis not present

## 2018-12-10 DIAGNOSIS — Z95828 Presence of other vascular implants and grafts: Secondary | ICD-10-CM

## 2018-12-10 DIAGNOSIS — N186 End stage renal disease: Secondary | ICD-10-CM

## 2018-12-10 DIAGNOSIS — E1151 Type 2 diabetes mellitus with diabetic peripheral angiopathy without gangrene: Secondary | ICD-10-CM | POA: Diagnosis not present

## 2018-12-10 DIAGNOSIS — I350 Nonrheumatic aortic (valve) stenosis: Secondary | ICD-10-CM | POA: Insufficient documentation

## 2018-12-10 DIAGNOSIS — I132 Hypertensive heart and chronic kidney disease with heart failure and with stage 5 chronic kidney disease, or end stage renal disease: Secondary | ICD-10-CM | POA: Insufficient documentation

## 2018-12-10 DIAGNOSIS — I509 Heart failure, unspecified: Secondary | ICD-10-CM | POA: Diagnosis not present

## 2018-12-10 HISTORY — PX: REVISON OF ARTERIOVENOUS FISTULA: SHX6074

## 2018-12-10 HISTORY — DX: Atherosclerotic heart disease of native coronary artery without angina pectoris: I25.10

## 2018-12-10 HISTORY — PX: INSERTION OF DIALYSIS CATHETER: SHX1324

## 2018-12-10 HISTORY — DX: Unspecified osteoarthritis, unspecified site: M19.90

## 2018-12-10 HISTORY — DX: Cardiac murmur, unspecified: R01.1

## 2018-12-10 HISTORY — DX: Gastro-esophageal reflux disease without esophagitis: K21.9

## 2018-12-10 LAB — POCT I-STAT 4, (NA,K, GLUC, HGB,HCT)
Glucose, Bld: 150 mg/dL — ABNORMAL HIGH (ref 70–99)
HCT: 36 % — ABNORMAL LOW (ref 39.0–52.0)
Hemoglobin: 12.2 g/dL — ABNORMAL LOW (ref 13.0–17.0)
Potassium: 4.5 mmol/L (ref 3.5–5.1)
Sodium: 137 mmol/L (ref 135–145)

## 2018-12-10 LAB — GLUCOSE, CAPILLARY: Glucose-Capillary: 205 mg/dL — ABNORMAL HIGH (ref 70–99)

## 2018-12-10 SURGERY — REVISON OF ARTERIOVENOUS FISTULA
Anesthesia: Monitor Anesthesia Care | Site: Neck | Laterality: Left

## 2018-12-10 MED ORDER — LIDOCAINE HCL (PF) 1 % IJ SOLN
INTRAMUSCULAR | Status: AC
  Filled 2018-12-10: qty 30

## 2018-12-10 MED ORDER — METOPROLOL TARTRATE 5 MG/5ML IV SOLN
2.5000 mg | Freq: Once | INTRAVENOUS | Status: AC
  Administered 2018-12-10: 2.5 mg via INTRAVENOUS

## 2018-12-10 MED ORDER — PROPOFOL 500 MG/50ML IV EMUL
INTRAVENOUS | Status: DC | PRN
  Administered 2018-12-10: 75 ug/kg/min via INTRAVENOUS

## 2018-12-10 MED ORDER — PROTAMINE SULFATE 10 MG/ML IV SOLN
INTRAVENOUS | Status: AC
  Filled 2018-12-10: qty 5

## 2018-12-10 MED ORDER — OXYCODONE HCL 5 MG PO TABS: 5.0000 mg | ORAL_TABLET | Freq: Four times a day (QID) | ORAL | 0 refills | Status: DC | PRN

## 2018-12-10 MED ORDER — SODIUM CHLORIDE 0.9 % IV SOLN
INTRAVENOUS | Status: DC
  Administered 2018-12-10 (×2): via INTRAVENOUS

## 2018-12-10 MED ORDER — LIDOCAINE HCL 1 % IJ SOLN
INTRAMUSCULAR | Status: DC | PRN
  Administered 2018-12-10: 20 mL

## 2018-12-10 MED ORDER — ALBUMIN HUMAN 5 % IV SOLN
INTRAVENOUS | Status: DC | PRN
  Administered 2018-12-10 (×2): via INTRAVENOUS

## 2018-12-10 MED ORDER — HEPARIN SODIUM (PORCINE) 1000 UNIT/ML IJ SOLN
INTRAMUSCULAR | Status: DC | PRN
  Administered 2018-12-10: 5000 [IU] via INTRAVENOUS

## 2018-12-10 MED ORDER — CHLORHEXIDINE GLUCONATE CLOTH 2 % EX PADS: 6.0000 | MEDICATED_PAD | Freq: Once | CUTANEOUS | Status: DC

## 2018-12-10 MED ORDER — PHENYLEPHRINE 40 MCG/ML (10ML) SYRINGE FOR IV PUSH (FOR BLOOD PRESSURE SUPPORT)
PREFILLED_SYRINGE | INTRAVENOUS | Status: DC | PRN
  Administered 2018-12-10: 160 ug via INTRAVENOUS
  Administered 2018-12-10: 80 ug via INTRAVENOUS

## 2018-12-10 MED ORDER — LIDOCAINE 2% (20 MG/ML) 5 ML SYRINGE
INTRAMUSCULAR | Status: DC | PRN
  Administered 2018-12-10: 80 mg via INTRAVENOUS

## 2018-12-10 MED ORDER — HEPARIN SODIUM (PORCINE) 1000 UNIT/ML IJ SOLN
INTRAMUSCULAR | Status: AC
  Filled 2018-12-10: qty 1

## 2018-12-10 MED ORDER — FENTANYL CITRATE (PF) 250 MCG/5ML IJ SOLN
INTRAMUSCULAR | Status: AC
  Filled 2018-12-10: qty 5

## 2018-12-10 MED ORDER — SODIUM CHLORIDE 0.9 % IV SOLN
INTRAVENOUS | Status: AC
  Filled 2018-12-10: qty 1.2

## 2018-12-10 MED ORDER — METOPROLOL TARTRATE 5 MG/5ML IV SOLN
INTRAVENOUS | Status: AC
  Filled 2018-12-10: qty 5

## 2018-12-10 MED ORDER — 0.9 % SODIUM CHLORIDE (POUR BTL) OPTIME
TOPICAL | Status: DC | PRN
  Administered 2018-12-10: 1000 mL

## 2018-12-10 MED ORDER — SODIUM CHLORIDE 0.9 % IV SOLN
INTRAVENOUS | Status: DC | PRN
  Administered 2018-12-10: 09:00:00 500 mL

## 2018-12-10 MED ORDER — HEPARIN SODIUM (PORCINE) 1000 UNIT/ML IJ SOLN
INTRAMUSCULAR | Status: DC | PRN
  Administered 2018-12-10: 3000 [IU] via INTRAVENOUS

## 2018-12-10 MED ORDER — FENTANYL CITRATE (PF) 100 MCG/2ML IJ SOLN: 25.0000 ug | INTRAMUSCULAR | Status: DC | PRN

## 2018-12-10 MED ORDER — ONDANSETRON HCL 4 MG/2ML IJ SOLN
INTRAMUSCULAR | Status: AC
  Filled 2018-12-10: qty 2

## 2018-12-10 MED ORDER — CEFAZOLIN SODIUM-DEXTROSE 2-4 GM/100ML-% IV SOLN
2.0000 g | INTRAVENOUS | Status: AC
  Administered 2018-12-10: 08:00:00 2 g via INTRAVENOUS
  Filled 2018-12-10: qty 100

## 2018-12-10 MED ORDER — EPHEDRINE SULFATE-NACL 50-0.9 MG/10ML-% IV SOSY
PREFILLED_SYRINGE | INTRAVENOUS | Status: DC | PRN
  Administered 2018-12-10: 10 mg via INTRAVENOUS

## 2018-12-10 MED ORDER — MIDAZOLAM HCL 2 MG/2ML IJ SOLN
INTRAMUSCULAR | Status: DC | PRN
  Administered 2018-12-10: 1 mg via INTRAVENOUS

## 2018-12-10 MED ORDER — SODIUM CHLORIDE 0.9 % IV SOLN
INTRAVENOUS | Status: DC | PRN
  Administered 2018-12-10: 30 ug/min via INTRAVENOUS

## 2018-12-10 MED ORDER — ONDANSETRON HCL 4 MG/2ML IJ SOLN
INTRAMUSCULAR | Status: DC | PRN
  Administered 2018-12-10: 4 mg via INTRAVENOUS

## 2018-12-10 MED ORDER — MIDAZOLAM HCL 2 MG/2ML IJ SOLN
INTRAMUSCULAR | Status: AC
  Filled 2018-12-10: qty 2

## 2018-12-10 MED ORDER — ALBUMIN HUMAN 5 % IV SOLN
12.5000 g | Freq: Once | INTRAVENOUS | Status: AC
  Administered 2018-12-10: 12.5 g via INTRAVENOUS

## 2018-12-10 MED ORDER — PROPOFOL 10 MG/ML IV BOLUS
INTRAVENOUS | Status: DC | PRN
  Administered 2018-12-10: 25 mg via INTRAVENOUS
  Administered 2018-12-10 (×2): 20 mg via INTRAVENOUS

## 2018-12-10 MED ORDER — PROTAMINE SULFATE 10 MG/ML IV SOLN
INTRAVENOUS | Status: DC | PRN
  Administered 2018-12-10: 20 mg via INTRAVENOUS

## 2018-12-10 MED ORDER — FENTANYL CITRATE (PF) 250 MCG/5ML IJ SOLN
INTRAMUSCULAR | Status: DC | PRN
  Administered 2018-12-10: 50 ug via INTRAVENOUS

## 2018-12-10 MED ORDER — ALBUMIN HUMAN 5 % IV SOLN
INTRAVENOUS | Status: AC
  Filled 2018-12-10: qty 250

## 2018-12-10 MED ORDER — EPHEDRINE 5 MG/ML INJ
INTRAVENOUS | Status: AC
  Filled 2018-12-10: qty 10

## 2018-12-10 SURGICAL SUPPLY — 47 items
ARMBAND PINK RESTRICT EXTREMIT (MISCELLANEOUS) ×4 IMPLANT
BANDAGE ACE 4X5 VEL STRL LF (GAUZE/BANDAGES/DRESSINGS) ×2 IMPLANT
BIOPATCH RED 1 DISK 7.0 (GAUZE/BANDAGES/DRESSINGS) ×1 IMPLANT
BIOPATCH RED 1IN DISK 7.0MM (GAUZE/BANDAGES/DRESSINGS) ×1
CANISTER SUCT 3000ML PPV (MISCELLANEOUS) ×4 IMPLANT
CATH PALINDROME RT-P 15FX19CM (CATHETERS) ×2 IMPLANT
CLIP VESOCCLUDE MED 6/CT (CLIP) ×4 IMPLANT
CLIP VESOCCLUDE SM WIDE 6/CT (CLIP) ×4 IMPLANT
COVER PROBE W GEL 5X96 (DRAPES) ×2 IMPLANT
COVER WAND RF STERILE (DRAPES) ×4 IMPLANT
DECANTER SPIKE VIAL GLASS SM (MISCELLANEOUS) ×4 IMPLANT
DERMABOND ADVANCED (GAUZE/BANDAGES/DRESSINGS) ×2
DERMABOND ADVANCED .7 DNX12 (GAUZE/BANDAGES/DRESSINGS) ×2 IMPLANT
DRAPE CHEST BREAST 15X10 FENES (DRAPES) ×2 IMPLANT
ELECT REM PT RETURN 9FT ADLT (ELECTROSURGICAL) ×4
ELECTRODE REM PT RTRN 9FT ADLT (ELECTROSURGICAL) ×2 IMPLANT
GAUZE SPONGE 4X4 12PLY STRL LF (GAUZE/BANDAGES/DRESSINGS) ×2 IMPLANT
GAUZE SPONGE 4X4 16PLY XRAY LF (GAUZE/BANDAGES/DRESSINGS) ×2 IMPLANT
GLOVE BIO SURGEON STRL SZ7.5 (GLOVE) ×4 IMPLANT
GLOVE BIOGEL PI IND STRL 8 (GLOVE) ×2 IMPLANT
GLOVE BIOGEL PI INDICATOR 8 (GLOVE) ×2
GOWN STRL REUS W/ TWL LRG LVL3 (GOWN DISPOSABLE) ×4 IMPLANT
GOWN STRL REUS W/ TWL XL LVL3 (GOWN DISPOSABLE) ×4 IMPLANT
GOWN STRL REUS W/TWL LRG LVL3 (GOWN DISPOSABLE) ×10
GOWN STRL REUS W/TWL XL LVL3 (GOWN DISPOSABLE) ×4
HEMOSTAT SPONGE AVITENE ULTRA (HEMOSTASIS) IMPLANT
KIT BASIN OR (CUSTOM PROCEDURE TRAY) ×4 IMPLANT
KIT TURNOVER KIT B (KITS) ×4 IMPLANT
NDL 18GX1X1/2 (RX/OR ONLY) (NEEDLE) IMPLANT
NEEDLE 18GX1X1/2 (RX/OR ONLY) (NEEDLE) ×4 IMPLANT
NS IRRIG 1000ML POUR BTL (IV SOLUTION) ×4 IMPLANT
PACK CV ACCESS (CUSTOM PROCEDURE TRAY) ×4 IMPLANT
PAD ARMBOARD 7.5X6 YLW CONV (MISCELLANEOUS) ×8 IMPLANT
STAPLER VISISTAT 35W (STAPLE) IMPLANT
SUT ETHILON 3 0 PS 1 (SUTURE) ×2 IMPLANT
SUT MNCRL AB 4-0 PS2 18 (SUTURE) ×6 IMPLANT
SUT PROLENE 5 0 C 1 24 (SUTURE) ×8 IMPLANT
SUT PROLENE 6 0 BV (SUTURE) ×2 IMPLANT
SUT PROLENE 7 0 BV 1 (SUTURE) IMPLANT
SUT VIC AB 3-0 SH 27 (SUTURE) ×2
SUT VIC AB 3-0 SH 27X BRD (SUTURE) ×2 IMPLANT
SYR 10ML LL (SYRINGE) ×2 IMPLANT
SYR 5ML LL (SYRINGE) ×2 IMPLANT
SYR 5ML LUER SLIP (SYRINGE) ×2 IMPLANT
TOWEL GREEN STERILE (TOWEL DISPOSABLE) ×4 IMPLANT
UNDERPAD 30X30 (UNDERPADS AND DIAPERS) ×4 IMPLANT
WATER STERILE IRR 1000ML POUR (IV SOLUTION) ×4 IMPLANT

## 2018-12-10 NOTE — Anesthesia Postprocedure Evaluation (Signed)
Anesthesia Post Note  Patient: Braxston Quinter  Procedure(s) Performed: REVISION OF ARTERIOVENOUS FISTULA LEFT ARM (Left ) INSERTION OF 15 FR X 19 CM PALINDROME DIALYSIS CATHETER (Left Neck)     Patient location during evaluation: PACU Anesthesia Type: MAC Level of consciousness: awake and alert Pain management: pain level controlled Vital Signs Assessment: post-procedure vital signs reviewed and stable Respiratory status: spontaneous breathing, nonlabored ventilation, respiratory function stable and patient connected to nasal cannula oxygen Cardiovascular status: stable and blood pressure returned to baseline Postop Assessment: no apparent nausea or vomiting Anesthetic complications: no    Last Vitals:  Vitals:   12/10/18 1220 12/10/18 1245  BP: 100/68 112/70  Pulse: 83 92  Resp: 14 16  Temp:  36.4 C  SpO2: 96% 96%    Last Pain:  Vitals:   12/10/18 0733  TempSrc: Oral                 Effie Berkshire

## 2018-12-10 NOTE — Anesthesia Procedure Notes (Addendum)
Procedure Name: MAC Date/Time: 12/10/2018 8:36 AM Performed by: Imagene Riches, CRNA Pre-anesthesia Checklist: Patient identified, Emergency Drugs available, Patient being monitored and Suction available Patient Re-evaluated:Patient Re-evaluated prior to induction Oxygen Delivery Method: Simple face mask

## 2018-12-10 NOTE — Discharge Instructions (Signed)
[facility]  Discharge Instructions  AV Fistula or Graft Surgery for Dialysis Access  Please refer to the following instructions for your post-procedure care. Your surgeon or physician assistant will discuss any changes with you.  Activity  You may drive the day following your surgery, if you are comfortable and no longer taking prescription pain medication. Resume full activity as the soreness in your incision resolves.  Bathing/Showering  You may shower after you go home. Keep your incision dry for 48 hours. Do not soak in a bathtub, hot tub, or swim until the incision heals completely. You may not shower if you have a hemodialysis catheter.  Incision Care  Clean your incision with mild soap and water after 48 hours. Pat the area dry with a clean towel. You do not need a bandage unless otherwise instructed. Do not apply any ointments or creams to your incision. You may have skin glue on your incision. Do not peel it off. It will come off on its own in about one week. Your arm may swell a bit after surgery. To reduce swelling use pillows to elevate your arm so it is above your heart. Your doctor will tell you if you need to lightly wrap your arm with an ACE bandage.  Remove bandage after your dialysis treatment.  Diet  Resume your normal diet. There are not special food restrictions following this procedure. In order to heal from your surgery, it is CRITICAL to get adequate nutrition. Your body requires vitamins, minerals, and protein. Vegetables are the best source of vitamins and minerals. Vegetables also provide the perfect balance of protein. Processed food has little nutritional value, so try to avoid this.  Medications  Resume taking all of your medications. If your incision is causing pain, you may take over-the counter pain relievers such as acetaminophen (Tylenol). If you were prescribed a stronger pain medication, please be aware these  medications can cause nausea and constipation. Prevent nausea by taking the medication with a snack or meal. Avoid constipation by drinking plenty of fluids and eating foods with high amount of fiber, such as fruits, vegetables, and grains.  Do not take Tylenol if you are taking prescription pain medications.  Follow up Your surgeon may want to see you in the office following your access surgery. If so, this will be arranged at the time of your surgery.  Please call us immediately for any of the following conditions:  Increased pain, redness, drainage (pus) from your incision site Fever of 101 degrees or higher Severe or worsening pain at your incision site Hand pain or numbness.  Reduce your risk of vascular disease:  Stop smoking. If you would like help, call [facility] at 1-800-QUIT-NOW (323) 098-3089) or [place] at [place] your cholesterol Maintain a desired weight Control your diabetes Keep your blood pressure down  Dialysis  It will take several weeks to several months for your new dialysis access to be ready for use. Your surgeon will determine when it is okay to use it. Your nephrologist will continue to direct your dialysis. You can continue to use your Permcath until your new access is ready for use.   12/10/2018 John Ortiz [id number] 04/30/1946  Surgeon(s): Marty Heck, MD  Procedure(s): Plication of left upper arm AV fistula and tunneled dialysis catheter placement.   x Do not stick fistula for 6 weeks    If you have any questions, please call the office at [phone]. \

## 2018-12-10 NOTE — Transfer of Care (Signed)
Immediate Anesthesia Transfer of Care Note  Patient: John Ortiz  Procedure(s) Performed: REVISION OF ARTERIOVENOUS FISTULA LEFT ARM (Left ) INSERTION OF 15 FR X 19 CM PALINDROME DIALYSIS CATHETER (Left Neck)  Patient Location: PACU  Anesthesia Type:MAC  Level of Consciousness: awake, alert  and oriented  Airway & Oxygen Therapy: Patient Spontanous Breathing and Patient connected to face mask oxygen  Post-op Assessment: Report given to RN and Post -op Vital signs reviewed and stable  Post vital signs: Reviewed and stable  Last Vitals:  Vitals Value Taken Time  BP 112/82 12/10/2018 10:39 AM  Temp    Pulse 94 12/10/2018 10:38 AM  Resp 23 12/10/2018 10:41 AM  SpO2 93 % 12/10/2018 10:38 AM  Vitals shown include unvalidated device data.  Last Pain:  Vitals:   12/10/18 0733  TempSrc: Oral         Complications: No apparent anesthesia complications

## 2018-12-10 NOTE — Op Note (Signed)
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  Revision of left upper extremity brachiocephalic fistula with aneurysm plication x2 2.  Ultrasound-guided access of the right internal jugular vein 3.  Placement of right internal jugular vein tunneled dialysis catheter (19 cm palindrome catheter)  Surgeon: Dr. Marty Heck, MD  Assistant: Leontine Locket, PA  Indications: Patient is a 73 year old male with ESRD who was recently seen in clinic with aneurysmal left upper extremity brachiocephalic fistula with tandem aneurysms.  He was subsequently scheduled for aneurysm revision with plication after risks and benefits were discussed including placement of a TDC.  Findings: Left brachiocephalic fistula had two tandem aneurysms that were circumferentially mobilized and then plicated.  There was an excellent thrill in the fistula at completion of the case.  Right IJ tunneled dialysis catheter was placed for temporary dialysis needs with the tip of the catheter in the right atrium.  Anesthesia: MAC  Details: The patient was taken to the operating room after informed consent was obtained.  He was placed on operative table in supine position.  His left arm was prepped and draped in usual sterile fashion.  A timeout was performed to identify patient, procedure and site.  Initially made a longitudinal incision over top of his brachiocephalic aneurysms in the mid left arm.  We included a skin ellipse of all the overlying thin skin around over the aneurysm.  We then used Bovie cautery and the both aneurysms were circumferentially dissected until we got Vesseloops proximal and distal.  At that point in time the patient was given 5000 units of IV heparin.  I then used fistula clamps proximally distally.  Each aneurysm was then opened with a Metzenbaum scissors and the wall was trimmed  until we had a fairly normal diameter fistula.  Then this was plicated in two separate segments with a small intervening normal segment with 5-0 Prolene in running fashion.  There was excellent hemostasis at completion.  I did use a 6-0 Prolene to loosely approximate the fistula to the underlying fascia to prevent the fistula from getting kinked.  I then washed out the wound with saline until the effluent was clear.  The subcutaneous tissue was closed with a running 3-0 Vicryl.  The skin was closed with a running 4-0 Monocryl.  Dermabond was applied.  Gentle Ace wrap was applied. I then turned my attention to the patient's neck and both the right and left neck were prepped and draped in sterile fashion.  I then used ultrasound and evaluated the right internal jugular vein, it was patent, and an image was saved.  I then used an 18-gauge needle and accessed the right internal jugular vein under ultrasound guidance.  I advanced a J-wire into the right atrium under fluoroscopic C arm without any resistance.  I then clamped the wire.  I then made a separate stab incision on the chest wall after measuring a 19 cm palindrome catheter.  I then tunneled from the stab incision on the chest wall to the IJ access site with a tunneler after injecting local 1% lidocaine.  The catheter was then cut at #1 [facility] and then attached to the tunneler and tunneled retrograde through the chest wall burying the cuff under the skin.  I then cut at #2 on the catheter and clamped the catheter.  I then used serial dilators over my wire and then placed a large dilator peel-away sheath  into the right atrium under fluoroscopy.  The inner dilator and wire were removed.  I then placed the catheter into the right atrium and peeled the sheath away and removed it.  I ensured there were no kinks.  The catheter flushed and withdrew easily.  I was happy with fluoroscopic placement in the right atrium.  I then attached the adapter to the end of the  catheter accordingly.  The catheter was sutured in two places to the chest wall with 3-0 nylon suture.  4-0 Monocryl buried subcuticular stitch was placed at the neck insertion site.  Dermabond was applied to all incisions.  Patient tolerated the procedure well without any apparent complications.  Complication: None  Condition: Stable  Marty Heck, MD [facility] [place] Office: [phone] Pager: [place]

## 2018-12-10 NOTE — H&P (Signed)
History and Physical Interval Note:  12/10/2018 7:49 AM  John Ortiz  has presented today for surgery, with the diagnosis of complication of fistula.  The various methods of treatment have been discussed with the patient and family. After consideration of risks, benefits and other options for treatment, the patient has consented to  Procedure(s): REVISION OF ARTERIOVENOUS FISTULA LEFT ARM (Left) as a surgical intervention.  The patient's history has been reviewed, patient examined, no change in status, stable for surgery.  I have reviewed the patient's chart and labs.  Questions were answered to the patient's satisfaction.    TDC and revision of aneurysmal left arm AVF  Marty Heck  Cc: referred by Dr. 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 (Nov 12, 1945) male with history of end-stage renal disease, diabetes, CHF that presented on 10-06-18 to Dr. Patience Musca monthfollow-up after right lower extremity angiogram with angioplasty of his anterior tibial arteryon 04/30/18.   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 Focal AT stenosis treated during angioplasty. Now has a PCP and getting blood sugar controlled. Also seeing podiatrist, Dr. March Rummage, in [place] every couple of weeks for local debridement.   No rest pain in either foot. No new wounds or tissue loss. States right leg still feels good overall.   Dr. Carlis Abbott last evaluated pt on 10-06-18. 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 He underwent right lower extremity angiogram with intervention. He actually had fairly decent runoff with patent tibial trifurcation and Dr. Carlis Abbott treated anterior tibial focal stenosis. On follow-up that day his dorsalis pedis and posterior tibial signals were very brisk and soundedneartriphasic. He had pulsatile  tracings with a toe pressure greater than 80 that should be adequate for wound healing. In talking with him that day he said that the wound showed signs of healing and then undergoes additional debridement with his podiatrist. Dr. Carlis Abbott told him he thought we would warrant another intervention if the wound shows failure to progress and/or gets worse or he develops new tissue loss. He otherwise had no claudication or rest pain that would warrant intervention. Dr. Carlis Abbott advised follow up in 6 months but told himto call if things worsen in the interim and we can get him set up for another arteriogram. In reviewing his imaging from September of last year he had patent three-vessel runoff that was very brisk into the foot.  Pt returns today at the request of Dr. 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 He denies any steal type sx's in left UE. He states that he is able to do 4 hours for HD on M-W-F.  He states that some of the staff have problems accessing his AVF, some have no problems.  He states that this is the only AVF that he has had, had it for 11 years, states he has had 2 previous revisions of what sounds like stenoses of his AVF.        Past Medical History:  Diagnosis Date  . Anemia in CKD (chronic kidney disease)   . Aneurysm (HCC)    Left arm  . CHF (congestive heart failure) ([place])   . Chronic kidney disease, stage V ([place])   . Coagulation defect ([place])   . Diabetes mellitus type 2, uncomplicated ([place])   . Diarrhea   .  Fluid overload   . Hypercalcemia   . Iron deficiency anemia, unspecified   . Non-healing ulcer of foot ([place])    Right foot  . Pain   . Pruritus   . Renal osteodystrophy   . Secondary hyperparathyroidism of renal origin ([place])   . Shortness of breath   . Type 2 diabetes mellitus with hypoglycemia without coma ([place])   . Unspecified protein-calorie malnutrition ([place])     Social History Social History        Tobacco Use   . Smoking status: Former Research scientist (life sciences)  . Smokeless tobacco: Never Used  Substance Use Topics  . Alcohol use: Never    Frequency: Never  . Drug use: Never    Family History History reviewed. No pertinent family history.  Surgical History      Past Surgical History:  Procedure Laterality Date  . ABDOMINAL AORTOGRAM W/LOWER EXTREMITY N/A 04/30/2018   Procedure: ABDOMINAL AORTOGRAM W/LOWER EXTREMITY;  Surgeon: Marty Heck, MD;  Location: [facility];  Service: Cardiovascular;  Laterality: N/A;  . av fistula transposed/ [place]    . PERIPHERAL VASCULAR BALLOON ANGIOPLASTY Right 04/30/2018   Procedure: PERIPHERAL VASCULAR BALLOON ANGIOPLASTY;  Surgeon: Marty Heck, MD;  Location: [facility];  Service: Cardiovascular;  Laterality: Right;  Anterior tibial         Allergies  Allergen Reactions  . Promethazine Other (See Comments)    Tremors, cold, restlessness.  . Povidone-Iodine Rash          Current Outpatient Medications  Medication Sig Dispense Refill  . aspirin EC 81 MG tablet Take 81 mg by mouth daily.    Marland Kitchen atorvastatin (LIPITOR) 40 MG tablet Take 1 tablet (40 mg total) by mouth daily. 90 tablet 3  . B Complex-C-Folic Acid (DIALYVITE 497) 0.8 MG TABS Take by mouth.    . calcium acetate (PHOSLO) 667 MG capsule Take 2,001 mg by mouth 3 (three) times daily with meals.    . clopidogrel (PLAVIX) 75 MG tablet Take 1 tablet (75 mg total) by mouth daily. 30 tablet 11  . Cyanocobalamin (B-12) 500 MCG TABS Take 500 mcg by mouth 3 (three) times daily.     Marland Kitchen FLUoxetine (PROZAC) 20 MG capsule Take 20 mg by mouth daily.    . furosemide (LASIX) 40 MG tablet Take 40 mg by mouth daily.     . insulin aspart (NOVOLOG FLEXPEN) 100 UNIT/ML FlexPen Inject 7-15 Units into the skin 3 (three) times daily with meals. Per sliding scale    . insulin glargine (LANTUS) 100 UNIT/ML injection Inject 4 Units into the skin 2 (two) times daily.     .  mupirocin ointment (BACTROBAN) 2 % Apply topically.    . silver sulfADIAZINE (SILVADENE) 1 % cream Apply pea-sized amount to wound daily. 50 g 0  . cinacalcet (SENSIPAR) 60 MG tablet Take by mouth.     No current facility-administered medications for this visit.      REVIEW OF SYSTEMS: see HPI for pertinent positives and negatives    PHYSICAL EXAMINATION:     Vitals:   11/06/18 1355  BP: 122/68  Pulse: 78  Resp: 18  Temp: 97.6 F (36.4 C)  TempSrc: Oral  SpO2: 95%  Weight: 181 lb (82.1 kg)  Height: 5\' 8"  (1.727 m)   Body mass index is 27.52 kg/m.  General: The patient appears his stated age.  Pt walks with a cane.  HEENT:  No gross abnormalities, long mustache Pulmonary: Respirations  are non-labored, CTAB Abdomen: Soft and non-tender with normal BS. Musculoskeletal: There are no major deformities.   Neurologic: No focal weakness or paresthesias are detected. Skin: There are no ulcer or rashes noted. Feet are pink and warm with brisk capillary refill. Pedal pulses are not palpable, bilateral femoral pulses are 2+ palpable. Psychiatric: The patient has normal affect. Cardiovascular: There is a regular rate and rhythm without significant murmur appreciated.  Bilateral radial pulses are 2+ palpable. Left upper arm AVF with palpable thrill, audible bruit, is aneurysmal, no visible pulsations, no thinning of skin at AVF.    Medical Decision Making  Kolbee Bogusz is a 73 y.o. male who presents with moderately aneurysmal left upper arm AVF. Pt denies steal sx's in left UE, there is no thinning of skin over AVF, pt denies any bleeding issues, states he dialyzes for 4 hours per session.   PAOD: no ulcers, no signs of ischemia in his feet or legs; pt denies claudication.    Dr. Donzetta Matters spoke with and examined pt.  Will schedule for revision of AVF access and placement of TDC.    Clemon Chambers, RN, MSN, FNP-C [facility]  [place] [place] Office: [phone]  11/06/2018, 2:23 PM  Clinic MD: Donzetta Matters

## 2018-12-10 NOTE — Anesthesia Preprocedure Evaluation (Signed)
Anesthesia Evaluation  Patient identified by MRN, date of birth, ID band Patient awake    Reviewed: Allergy & Precautions, NPO status , Patient's Chart, lab work & pertinent test results  Airway Mallampati: II  TM Distance: >3 FB Neck ROM: Full    Dental  (+) Teeth Intact, Dental Advisory Given   Pulmonary former smoker,    breath sounds clear to auscultation       Cardiovascular hypertension, Pt. on medications + CAD, + CABG, + Peripheral Vascular Disease and +CHF  + Valvular Problems/Murmurs  Rhythm:Regular Rate:Normal  S/p AVR   Neuro/Psych negative neurological ROS  negative psych ROS   GI/Hepatic Neg liver ROS, GERD  ,  Endo/Other  diabetes, Type 2, Insulin Dependent  Renal/GU ESRF and DialysisRenal disease     Musculoskeletal  (+) Arthritis ,   Abdominal Normal abdominal exam  (+)   Peds  Hematology negative hematology ROS (+)   Anesthesia Other Findings   Reproductive/Obstetrics                             Anesthesia Physical Anesthesia Plan  ASA: III  Anesthesia Plan: MAC   Post-op Pain Management:    Induction: Intravenous  PONV Risk Score and Plan: 2 and Ondansetron and Propofol infusion  Airway Management Planned: Natural Airway and Simple Face Mask  Additional Equipment: None  Intra-op Plan:   Post-operative Plan:   Informed Consent:   Plan Discussed with: CRNA  Anesthesia Plan Comments:         Anesthesia Quick Evaluation

## 2018-12-11 ENCOUNTER — Encounter (HOSPITAL_COMMUNITY): Payer: Self-pay | Admitting: Vascular Surgery

## 2018-12-11 ENCOUNTER — Telehealth: Payer: Self-pay | Admitting: Vascular Surgery

## 2018-12-11 NOTE — Telephone Encounter (Signed)
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  Revision of left upper extremity brachiocephalic fistula with aneurysm plication x2 2.  Ultrasound-guided access of the right internal jugular vein 3.  Placement of right internal jugular vein tunneled dialysis catheter (19 cm palindrome catheter)  Surgeon: Dr. Marty Heck, MD  Assistant: Leontine Locket, PA  #He can have a wound check with NP in 3-4 weeks.  Thanks,  Gerald Stabs

## 2018-12-11 NOTE — Telephone Encounter (Signed)
sch appt spk to pt wife mld ltr 01/12/2019 10am Wound check MD

## 2018-12-15 ENCOUNTER — Encounter: Payer: Self-pay | Admitting: Podiatry

## 2018-12-15 ENCOUNTER — Other Ambulatory Visit: Payer: Self-pay

## 2018-12-15 ENCOUNTER — Ambulatory Visit (INDEPENDENT_AMBULATORY_CARE_PROVIDER_SITE_OTHER): Payer: Medicare Other | Admitting: Podiatry

## 2018-12-15 VITALS — BP 108/50 | HR 76 | Temp 97.0°F | Resp 16

## 2018-12-15 DIAGNOSIS — S93491A Sprain of other ligament of right ankle, initial encounter: Secondary | ICD-10-CM

## 2018-12-15 NOTE — Progress Notes (Signed)
Subjective:  Patient ID: John Ortiz, male    DOB: Feb 01, 1946,  MRN: [id number]  Chief Complaint  Patient presents with  . Wound Check    F/U rt wound check Pt.s tates," doien better, it's almost closed." tx: silver nitrate -pt denies draiange,rednes sor swelling  . Foot Injury    Rt foot injury (twisted foot) x 2 wks; 9/10 shapr pains -pt states he had XRs at [facility] and was dx with a sprain Tx: oxy (Rx by [facility])    DOS: 09/01/2018 Procedure: Rt T[place]illes lengthening, debridement of ulcer, application of skin graft substitute, application of TCC. Correction of Left 2nd hammertoe  73 y.o. male returns for post-op check. Hx as above.  States the wound is looking healed.  New injury to the right ankle, states he sprained it.  Review of Systems: Negative except as noted in the HPI. Denies N/V/F/Ch.  Past Medical History:  Diagnosis Date  . Anemia in CKD (chronic kidney disease)   . Aneurysm (HCC)    Left arm  . Arthritis   . CHF (congestive heart failure) ([place])   . Chronic kidney disease, stage V ([place])    MWF- [place]  . Coagulation defect ([place])   . Coronary artery disease   . Diabetes mellitus type 2, uncomplicated ([place])   . Diarrhea   . Fluid overload   . GERD (gastroesophageal reflux disease)   . Heart murmur   . Hypercalcemia   . Iron deficiency anemia, unspecified   . Non-healing ulcer of foot ([place])    Right foot  . Pain   . Pruritus   . Renal osteodystrophy   . Secondary hyperparathyroidism of renal origin ([place])   . Shortness of breath   . Type 2 diabetes mellitus with hypoglycemia without coma ([place])   . Unspecified protein-calorie malnutrition ([place])     Current Outpatient Medications:  .  acetaminophen (TYLENOL) 500 MG tablet, Take 1,000 mg by mouth at bedtime as needed for moderate pain., Disp: , Rfl:  .  aspirin EC 81 MG tablet, Take 81 mg by mouth at bedtime. , Disp: , Rfl:  .  B Complex-C-Folic Acid (DIALYVITE 262) 0.8 MG TABS, Take 1 tablet by mouth  daily with lunch. , Disp: , Rfl:  .  calcium acetate (PHOSLO) 667 MG capsule, Take 2,001 mg by mouth 3 (three) times daily with meals., Disp: , Rfl:  .  clopidogrel (PLAVIX) 75 MG tablet, Take 1 tablet (75 mg total) by mouth daily. (Patient taking differently: Take 75 mg by mouth daily with lunch. ), Disp: 30 tablet, Rfl: 11 .  FLUoxetine (PROZAC) 20 MG capsule, Take 20 mg by mouth daily., Disp: , Rfl:  .  furosemide (LASIX) 80 MG tablet, Take 80 mg by mouth 2 (two) times daily., Disp: , Rfl:  .  glucose 4 GM chewable tablet, Chew 1-5 tablets by mouth as needed for low blood sugar., Disp: , Rfl:  .  insulin aspart (NOVOLOG FLEXPEN) 100 UNIT/ML FlexPen, Inject 7-15 Units into the skin 3 (three) times daily with meals. Sliding Scale Insulin, Disp: , Rfl:  .  insulin glargine (LANTUS) 100 UNIT/ML injection, Inject 8 Units into the skin 2 (two) times daily. , Disp: , Rfl:  .  oxyCODONE (ROXICODONE) 5 MG immediate release tablet, Take 1 tablet (5 mg total) by mouth every 6 (six) hours as needed., Disp: 12 tablet, Rfl: 0 .  silver sulfADIAZINE (SILVADENE) 1 % cream, Apply pea-sized amount to wound daily. (Patient taking differently: Apply 1  application topically daily. Apply pea-sized amount to wound daily.), Disp: 50 g, Rfl: 0 .  simvastatin (ZOCOR) 40 MG tablet, Take 40 mg by mouth at bedtime., Disp: , Rfl:  .  vitamin B-12 (CYANOCOBALAMIN) 500 MCG tablet, Take 500 mcg by mouth daily., Disp: , Rfl:   Social History   Tobacco Use  Smoking Status Former Smoker  . Years: 15.00  . Last attempt to quit: 01/24/1989  . Years since quitting: 29.9  Smokeless Tobacco Never Used    Allergies  Allergen Reactions  . Promethazine Other (See Comments)    Tremors, cold, restlessness.  . Povidone-Iodine Rash   Objective:   Vitals:   12/15/18 1003  BP: (!) 108/50  Pulse: 76  Resp: 16  Temp: (!) 97 F (36.1 C)   There is no height or weight on file to calculate BMI. Constitutional Well developed.  Well nourished.  Vascular Foot warm and well perfused. Capillary refill normal to all digits.   Neurologic Normal speech. Oriented to person, place, and time. Epicritic sensation to light touch grossly present bilaterally.  Dermatologic Skin well healed left Ulcer healed left second toe. Right first MPJ ulceration healed   Orthopedic: No tenderness to palpation noted about the surgical site. 5 degrees of ankle DF ROM right POP R ATFl Scant edema right ankle.   Radiographs: None Assessment:   1. Sprain of anterior talofibular ligament of right ankle, initial encounter    Plan:  Patient was evaluated and treated and all questions answered.  R 1st MPJ Ulcer -Healed upon debridement  R Ankle Sprain -XR reviewed from [facility]. No evidence of osseous injury -Discussed icing, elevation. Will avoid ankle brace due to incompatibility with DM shoes.  Return in about 1 month (around 01/14/2019) for Wound Care R, Routine Foot Care .

## 2018-12-21 DIAGNOSIS — S93401A Sprain of unspecified ligament of right ankle, initial encounter: Secondary | ICD-10-CM | POA: Insufficient documentation

## 2019-01-11 ENCOUNTER — Telehealth (HOSPITAL_COMMUNITY): Payer: Self-pay | Admitting: Rehabilitation

## 2019-01-11 NOTE — Telephone Encounter (Signed)

## 2019-01-12 ENCOUNTER — Encounter: Payer: Self-pay | Admitting: Vascular Surgery

## 2019-01-12 ENCOUNTER — Ambulatory Visit (INDEPENDENT_AMBULATORY_CARE_PROVIDER_SITE_OTHER): Payer: Self-pay | Admitting: Vascular Surgery

## 2019-01-12 ENCOUNTER — Other Ambulatory Visit: Payer: Self-pay

## 2019-01-12 VITALS — BP 149/87 | HR 80 | Temp 97.8°F | Resp 18 | Ht 68.0 in | Wt 180.0 lb

## 2019-01-12 DIAGNOSIS — N186 End stage renal disease: Secondary | ICD-10-CM

## 2019-01-12 DIAGNOSIS — Z992 Dependence on renal dialysis: Secondary | ICD-10-CM

## 2019-01-12 NOTE — Progress Notes (Signed)
Patient name: John Ortiz MRN: [id number] DOB: 1945/09/03 Sex: male  REASON FOR VISIT: Follow-up after left arm brachiocephalic aneurysm revision  HPI: John Ortiz is a 73 y.o. male with multiple medical problems including end-stage renal disease that presents for follow-up after left upper arm brachiocephalic AV fistula revision.  She had an aneurysmal left brachiocephalic fistula and underwent aneurysm plication x2 on December 10, 2018 with placement of a right IJ tunneled catheter.  He states catheter has been working well.  He still feels a thrill in his fistula.  His incisions have healed without any issues.  He is having no left arm pain.  No specific concerns today.  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  He is being followed by podiatry and states that wound is now completely healed.  Past Medical History:  Diagnosis Date  . Anemia in CKD (chronic kidney disease)   . Aneurysm (HCC)    Left arm  . Arthritis   . CHF (congestive heart failure) ([place])   . Chronic kidney disease, stage V ([place])    MWF- [place]  . Coagulation defect ([place])   . Coronary artery disease   . Diabetes mellitus type 2, uncomplicated ([place])   . Diarrhea   . Fluid overload   . GERD (gastroesophageal reflux disease)   . Heart murmur   . Hypercalcemia   . Iron deficiency anemia, unspecified   . Non-healing ulcer of foot ([place])    Right foot  . Pain   . Pruritus   . Renal osteodystrophy   . Secondary hyperparathyroidism of renal origin ([place])   . Shortness of breath   . Type 2 diabetes mellitus with hypoglycemia without coma ([place])   . Unspecified protein-calorie malnutrition ([place])     Past Surgical History:  Procedure Laterality Date  . ABDOMINAL AORTOGRAM W/LOWER EXTREMITY N/A 04/30/2018   Procedure: ABDOMINAL AORTOGRAM W/LOWER EXTREMITY;  Surgeon:  Marty Heck, MD;  Location: [facility];  Service: Cardiovascular;  Laterality: N/A;  . AORTIC VALVE REPLACEMENT  2016  . av fistula transposed/ [place]    . COLONOSCOPY    . CORONARY ANGIOPLASTY  2017   stent   . CORONARY ARTERY BYPASS GRAFT  2008  . FOOT SURGERY Right    arch repair, and wound repair  . HAMMER TOE SURGERY Left   . INSERTION OF DIALYSIS CATHETER Left 12/10/2018   Procedure: INSERTION OF 15 FR X 19 CM PALINDROME DIALYSIS CATHETER;  Surgeon: Marty Heck, MD;  Location: [facility];  Service: Vascular;  Laterality: Left;  . PERIPHERAL VASCULAR BALLOON ANGIOPLASTY Right 04/30/2018   Procedure: PERIPHERAL VASCULAR BALLOON ANGIOPLASTY;  Surgeon: Marty Heck, MD;  Location: [facility];  Service: Cardiovascular;  Laterality: Right;  Anterior tibial  . REVISON OF ARTERIOVENOUS FISTULA Left 12/10/2018   Procedure: REVISION OF ARTERIOVENOUS FISTULA LEFT ARM;  Surgeon: Marty Heck, MD;  Location: [facility];  Service: Vascular;  Laterality: Left;    History reviewed. No pertinent family history.  SOCIAL HISTORY: Social History   Tobacco Use  . Smoking status: Former Smoker    Years: 15.00    Last attempt to quit: 01/24/1989    Years since quitting: 29.9  . Smokeless tobacco: Never Used  Substance Use Topics  . Alcohol use: Not Currently    Frequency: Never    Allergies  Allergen Reactions  . Promethazine Other (See Comments)    Tremors, cold, restlessness.  . Povidone-Iodine Rash    Current Outpatient Medications  Medication Sig Dispense Refill  . acetaminophen (TYLENOL) 500 MG tablet Take 1,000 mg by mouth at bedtime as needed for moderate pain.    Marland Kitchen aspirin EC 81 MG tablet Take 81 mg by mouth at bedtime.     . B Complex-C-Folic Acid (DIALYVITE 338) 0.8 MG TABS Take 1 tablet by mouth daily with lunch.     . calcium acetate (PHOSLO) 667 MG capsule Take 2,001 mg by mouth 3 (three) times daily with meals.    . clopidogrel  (PLAVIX) 75 MG tablet Take 1 tablet (75 mg total) by mouth daily. (Patient taking differently: Take 75 mg by mouth daily with lunch. ) 30 tablet 11  . FLUoxetine (PROZAC) 20 MG capsule Take 20 mg by mouth daily.    . furosemide (LASIX) 80 MG tablet Take 80 mg by mouth 2 (two) times daily.    Marland Kitchen glucose 4 GM chewable tablet Chew 1-5 tablets by mouth as needed for low blood sugar.    . insulin aspart (NOVOLOG FLEXPEN) 100 UNIT/ML FlexPen Inject 7-15 Units into the skin 3 (three) times daily with meals. Sliding Scale Insulin    . insulin glargine (LANTUS) 100 UNIT/ML injection Inject 8 Units into the skin 2 (two) times daily.     Marland Kitchen oxyCODONE (ROXICODONE) 5 MG immediate release tablet Take 1 tablet (5 mg total) by mouth every 6 (six) hours as needed. 12 tablet 0  . silver sulfADIAZINE (SILVADENE) 1 % cream Apply pea-sized amount to wound daily. (Patient taking differently: Apply 1 application topically daily. Apply pea-sized amount to wound daily.) 50 g 0  . simvastatin (ZOCOR) 40 MG tablet Take 40 mg by mouth at bedtime.    . vitamin B-12 (CYANOCOBALAMIN) 500 MCG tablet Take 500 mcg by mouth daily.     No current facility-administered medications for this visit.     REVIEW OF SYSTEMS:  [X]  denotes positive finding, [ ]  denotes negative finding Cardiac  Comments:  Chest pain or chest pressure:    Shortness of breath upon exertion:    Short of breath when lying flat:    Irregular heart rhythm:        Vascular    Pain in calf, thigh, or hip brought on by ambulation:    Pain in feet at night that wakes you up from your sleep:     Blood clot in your veins:    Leg swelling:         Pulmonary    Oxygen at home:    Productive cough:     Wheezing:         Neurologic    Sudden weakness in arms or legs:     Sudden numbness in arms or legs:     Sudden onset of difficulty speaking or slurred speech:    Temporary loss of vision in one eye:     Problems with dizziness:         Gastrointestinal     Blood in stool:     Vomited blood:         Genitourinary    Burning when urinating:     Blood in urine:        Psychiatric    Major depression:         Hematologic    Bleeding problems:    Problems with blood clotting too easily:  Skin    Rashes or ulcers:        Constitutional    Fever or chills:      PHYSICAL EXAM: Vitals:   01/12/19 1016  BP: (!) 149/87  Pulse: 80  Resp: 18  Temp: 97.8 F (36.6 C)  TempSrc: Oral  SpO2: 100%  Weight: 180 lb (81.6 kg)  Height: 5\' 8"  (1.727 m)    GENERAL: The patient is a well-nourished male, in no acute distress. The vital signs are documented above. CARDIAC: There is a regular rate and rhythm.  VASCULAR:  Left brachiocephalic AVF with good thrill, incisions well healed, palpable left radial pulse  DATA:   None  Assessment/Plan:  73 year old male with end-stage renal disease that presents for one-month follow-up after revision of his left brachiocephalic fistula with aneurysm plication x2.  These incisions have healed well and he still has a great thrill in the fistula.  I am okay with dialysis trying to access the fistula now and assuming it works well for several sessions his right IJ catheter can be removed.  Discussed he can have PRN follow-up with [place] moving forward.  Marty Heck, MD [facility] Office: [phone] Pager: [place]

## 2019-01-19 ENCOUNTER — Other Ambulatory Visit: Payer: Self-pay

## 2019-01-19 ENCOUNTER — Ambulatory Visit (INDEPENDENT_AMBULATORY_CARE_PROVIDER_SITE_OTHER): Payer: Medicare Other | Admitting: Podiatry

## 2019-01-19 ENCOUNTER — Encounter: Payer: Self-pay | Admitting: Podiatry

## 2019-01-19 VITALS — Temp 97.6°F | Resp 16

## 2019-01-19 DIAGNOSIS — E08621 Diabetes mellitus due to underlying condition with foot ulcer: Secondary | ICD-10-CM | POA: Diagnosis not present

## 2019-01-19 DIAGNOSIS — L97411 Non-pressure chronic ulcer of right heel and midfoot limited to breakdown of skin: Secondary | ICD-10-CM | POA: Diagnosis not present

## 2019-01-19 DIAGNOSIS — B351 Tinea unguium: Secondary | ICD-10-CM | POA: Diagnosis not present

## 2019-01-19 DIAGNOSIS — Z89412 Acquired absence of left great toe: Secondary | ICD-10-CM | POA: Diagnosis not present

## 2019-01-19 NOTE — Progress Notes (Signed)
Subjective:  Patient ID: John Ortiz, male    DOB: [birth year],  MRN: [id number]  Chief Complaint  Patient presents with  . Wound Check    F/UY Rt MPJ ulcer Pt. states," ulcer not there anymore, it's healed." Tx: silvadene cream -pt denie sN/V/F/CH/drainage/rednes/swelling  . debride    BL nail trimming   . Diabetes    FSBS: 280 A1C: unkwon   DOS: 09/01/2018 Procedure: Rt [place] lengthening, debridement of ulcer, application of skin graft substitute, application of TCC. Correction of Left 2nd hammertoe  73 y.o. male returns for routine foot care. States the ulcer to the right foot remains healed. Denies issues.  Review of Systems: Negative except as noted in the HPI. Denies N/V/F/Ch.  Past Medical History:  Diagnosis Date  . Anemia in CKD (chronic kidney disease)   . Aneurysm (HCC)    Left arm  . Arthritis   . CHF (congestive heart failure) ([place])   . Chronic kidney disease, stage V ([place])    MWF-   . Coagulation defect ([place])   . Coronary artery disease   . Diabetes mellitus type 2, uncomplicated ([place])   . Diarrhea   . Fluid overload   . GERD (gastroesophageal reflux disease)   . Heart murmur   . Hypercalcemia   . Iron deficiency anemia, unspecified   . Non-healing ulcer of foot ([place])    Right foot  . Pain   . Pruritus   . Renal osteodystrophy   . Secondary hyperparathyroidism of renal origin ([place])   . Shortness of breath   . Type 2 diabetes mellitus with hypoglycemia without coma ([place])   . Unspecified protein-calorie malnutrition ([place])     Current Outpatient Medications:  .  acetaminophen (TYLENOL) 500 MG tablet, Take 1,000 mg by mouth at bedtime as needed for moderate pain., Disp: , Rfl:  .  aspirin EC 81 MG tablet, Take 81 mg by mouth at bedtime. , Disp: , Rfl:  .  B Complex-C-Folic Acid (DIALYVITE 962) 0.8 MG TABS, Take 1 tablet by mouth daily with lunch. , Disp: , Rfl:  .  calcium acetate (PHOSLO) 667 MG capsule, Take 2,001 mg by mouth 3  (three) times daily with meals., Disp: , Rfl:  .  clopidogrel (PLAVIX) 75 MG tablet, Take 1 tablet (75 mg total) by mouth daily. (Patient taking differently: Take 75 mg by mouth daily with lunch. ), Disp: 30 tablet, Rfl: 11 .  FLUoxetine (PROZAC) 20 MG capsule, Take 20 mg by mouth daily., Disp: , Rfl:  .  furosemide (LASIX) 80 MG tablet, Take 80 mg by mouth 2 (two) times daily., Disp: , Rfl:  .  glucose 4 GM chewable tablet, Chew 1-5 tablets by mouth as needed for low blood sugar., Disp: , Rfl:  .  glucose blood (PRECISION XTRA TEST STRIPS) test strip, Use one strip to test blood sugar as directed, Disp: , Rfl:  .  insulin aspart (NOVOLOG FLEXPEN) 100 UNIT/ML FlexPen, Inject 7-15 Units into the skin 3 (three) times daily with meals. Sliding Scale Insulin, Disp: , Rfl:  .  insulin glargine (LANTUS) 100 UNIT/ML injection, Inject 8 Units into the skin 2 (two) times daily. , Disp: , Rfl:  .  Insulin Pen Needle (FIFTY50 PEN NEEDLES) 31G X 8 MM MISC, 1 each by Misc.(Non-Drug; Combo Route) route 3 (three) times daily as needed., Disp: , Rfl:  .  oxyCODONE (ROXICODONE) 5 MG immediate release tablet, Take 1 tablet (5 mg total) by mouth every 6 (six) hours  as needed., Disp: 12 tablet, Rfl: 0 .  silver sulfADIAZINE (SILVADENE) 1 % cream, Apply pea-sized amount to wound daily. (Patient taking differently: Apply 1 application topically daily. Apply pea-sized amount to wound daily.), Disp: 50 g, Rfl: 0 .  simvastatin (ZOCOR) 40 MG tablet, Take 40 mg by mouth at bedtime., Disp: , Rfl:  .  TechLite Lancets MISC, 1 each by Misc.(Non-Drug; Combo Route) route daily., Disp: , Rfl:  .  vitamin B-12 (CYANOCOBALAMIN) 500 MCG tablet, Take 500 mcg by mouth daily., Disp: , Rfl:   Social History   Tobacco Use  Smoking Status Former Smoker  . Years: 15.00  . Last attempt to quit: 01/24/1989  . Years since quitting: 30.0  Smokeless Tobacco Never Used    Allergies  Allergen Reactions  . Promethazine Other (See Comments)     Tremors, cold, restlessness.  . Povidone-Iodine Rash   Objective:   Vitals:   01/19/19 1045  Resp: 16  Temp: 97.6 F (36.4 C)   There is no height or weight on file to calculate BMI. Constitutional Well developed. Well nourished.  Vascular Foot warm and well perfused. Capillary refill normal to all digits.   Neurologic Normal speech. Oriented to person, place, and time. Epicritic sensation to light touch grossly present bilaterally.  Dermatologic Nails thickened, dystrophic x9 Ulcer healed left second toe. Right first MPJ ulceration remains healed.  Orthopedic: Hammertoes bilat. Hx of hallux amputation left.   Radiographs: None Assessment:   1. Diabetic ulcer of right midfoot associated with diabetes mellitus due to underlying condition, limited to breakdown of skin ([place])   2. Onychomycosis   3. History of amputation of left great toe [facility])    Plan:  Patient was evaluated and treated and all questions answered.  R 1st MPJ Ulcer -Remains healed  Onychomycosis with Amputation Hx -Nails debrided x9  Procedure: Nail Debridement Rationale: Patient meets criteria for routine foot care due to Amputation hx Type of Debridement: manual, sharp debridement. Instrumentation: Nail nipper, rotary burr. Number of Nails: 9     Return in about 9 weeks (around 03/23/2019) for Diabetic Foot Care.

## 2019-02-16 ENCOUNTER — Encounter: Payer: Self-pay | Admitting: Podiatry

## 2019-02-16 ENCOUNTER — Ambulatory Visit (INDEPENDENT_AMBULATORY_CARE_PROVIDER_SITE_OTHER): Payer: Medicare Other | Admitting: Podiatry

## 2019-02-16 ENCOUNTER — Other Ambulatory Visit: Payer: Self-pay

## 2019-02-16 ENCOUNTER — Ambulatory Visit (INDEPENDENT_AMBULATORY_CARE_PROVIDER_SITE_OTHER): Payer: Medicare Other

## 2019-02-16 VITALS — Temp 98.4°F | Resp 16

## 2019-02-16 DIAGNOSIS — T148XXA Other injury of unspecified body region, initial encounter: Secondary | ICD-10-CM

## 2019-02-16 NOTE — Progress Notes (Signed)
Subjective:  Patient ID: John Ortiz, male    DOB: [birth year],  MRN: [id number]  Chief Complaint  Patient presents with  . Blister    Lt plantar foot blister x 3-4 days; 7/10 sharp achy pain radiates up the leg Tx: none -w/ redness -pt denies swelling/drainage    73 y.o. male presents with the above complaint. Hx as above. Not sure how it started.  Review of Systems: Negative except as noted in the HPI. Denies N/V/F/Ch.  Past Medical History:  Diagnosis Date  . Anemia in CKD (chronic kidney disease)   . Aneurysm (HCC)    Left arm  . Arthritis   . CHF (congestive heart failure) ([place])   . Chronic kidney disease, stage V ([place])    MWF- [place]  . Coagulation defect ([place])   . Coronary artery disease   . Diabetes mellitus type 2, uncomplicated ([place])   . Diarrhea   . Fluid overload   . GERD (gastroesophageal reflux disease)   . Heart murmur   . Hypercalcemia   . Iron deficiency anemia, unspecified   . Non-healing ulcer of foot ([place])    Right foot  . Pain   . Pruritus   . Renal osteodystrophy   . Secondary hyperparathyroidism of renal origin ([place])   . Shortness of breath   . Type 2 diabetes mellitus with hypoglycemia without coma ([place])   . Unspecified protein-calorie malnutrition ([place])     Current Outpatient Medications:  .  acetaminophen (TYLENOL) 500 MG tablet, Take 1,000 mg by mouth at bedtime as needed for moderate pain., Disp: , Rfl:  .  aspirin EC 81 MG tablet, Take 81 mg by mouth at bedtime. , Disp: , Rfl:  .  B Complex-C-Folic Acid (DIALYVITE 893) 0.8 MG TABS, Take 1 tablet by mouth daily with lunch. , Disp: , Rfl:  .  calcium acetate (PHOSLO) 667 MG capsule, Take 2,001 mg by mouth 3 (three) times daily with meals., Disp: , Rfl:  .  clopidogrel (PLAVIX) 75 MG tablet, Take 1 tablet (75 mg total) by mouth daily. (Patient taking differently: Take 75 mg by mouth daily with lunch. ), Disp: 30 tablet, Rfl: 11 .  FLUoxetine (PROZAC) 20 MG capsule, Take 20 mg by  mouth daily., Disp: , Rfl:  .  furosemide (LASIX) 80 MG tablet, Take 80 mg by mouth 2 (two) times daily., Disp: , Rfl:  .  glucose 4 GM chewable tablet, Chew 1-5 tablets by mouth as needed for low blood sugar., Disp: , Rfl:  .  glucose blood (PRECISION XTRA TEST STRIPS) test strip, Use one strip to test blood sugar as directed, Disp: , Rfl:  .  insulin aspart (NOVOLOG FLEXPEN) 100 UNIT/ML FlexPen, Inject 7-15 Units into the skin 3 (three) times daily with meals. Sliding Scale Insulin, Disp: , Rfl:  .  insulin glargine (LANTUS) 100 UNIT/ML injection, Inject 8 Units into the skin 2 (two) times daily. , Disp: , Rfl:  .  Insulin Pen Needle (FIFTY50 PEN NEEDLES) 31G X 8 MM MISC, 1 each by Misc.(Non-Drug; Combo Route) route 3 (three) times daily as needed., Disp: , Rfl:  .  oxyCODONE (ROXICODONE) 5 MG immediate release tablet, Take 1 tablet (5 mg total) by mouth every 6 (six) hours as needed., Disp: 12 tablet, Rfl: 0 .  silver sulfADIAZINE (SILVADENE) 1 % cream, Apply pea-sized amount to wound daily. (Patient taking differently: Apply 1 application topically daily. Apply pea-sized amount to wound daily.), Disp: 50 g, Rfl: 0 .  simvastatin (ZOCOR) 40 MG tablet, Take 40 mg by mouth at bedtime., Disp: , Rfl:  .  TechLite Lancets MISC, 1 each by Misc.(Non-Drug; Combo Route) route daily., Disp: , Rfl:  .  vitamin B-12 (CYANOCOBALAMIN) 500 MCG tablet, Take 500 mcg by mouth daily., Disp: , Rfl:   Social History   Tobacco Use  Smoking Status Former Smoker  . Years: 15.00  . Quit date: 01/24/1989  . Years since quitting: 30.0  Smokeless Tobacco Never Used    Allergies  Allergen Reactions  . Promethazine Other (See Comments)    Tremors, cold, restlessness.  . Povidone-Iodine Rash   Objective:   Vitals:   02/16/19 1317  Resp: 16  Temp: 98.4 F (36.9 C)   There is no height or weight on file to calculate BMI. Constitutional Well developed. Well nourished.  Vascular Dorsalis pedis pulses barely  palpable bilaterally. Posterior tibial pulses non palpable bilaterally. Capillary refill normal to all digits.  No cyanosis or clubbing noted. Pedal hair growth normal.  Neurologic Normal speech. Oriented to person, place, and time. Epicritic sensation to light touch grossly present bilaterally.  Dermatologic Nails normal Skin left plantar foot large blister without warmth erythema signs of infection, serous drainage under blister with redudant skin  Orthopedic: Normal joint ROM without pain or crepitus bilaterally.   Radiographs: No fractures or osseous erosions Assessment:   1. Blister    Plan:  Patient was evaluated and treated and all questions answered.  Blister left plantar foot -XRs taken and reviewed no underlying osseous erosions -Blister incised, deroofed with tissue nipper. Area dried with betadine. -Advised to apply triple abx ointment qOD until healed.  Return in about 2 weeks (around 03/02/2019) for Blister check left foot.

## 2019-02-24 ENCOUNTER — Telehealth: Payer: Self-pay | Admitting: Podiatry

## 2019-02-24 NOTE — Telephone Encounter (Signed)
Please fax notes from office visit on 02/16/19 when possible

## 2019-03-02 ENCOUNTER — Encounter: Payer: Self-pay | Admitting: Podiatry

## 2019-03-02 ENCOUNTER — Ambulatory Visit (INDEPENDENT_AMBULATORY_CARE_PROVIDER_SITE_OTHER): Payer: Medicare Other | Admitting: Podiatry

## 2019-03-02 ENCOUNTER — Other Ambulatory Visit: Payer: Self-pay

## 2019-03-02 VITALS — Temp 97.4°F

## 2019-03-02 DIAGNOSIS — E1142 Type 2 diabetes mellitus with diabetic polyneuropathy: Secondary | ICD-10-CM

## 2019-03-02 DIAGNOSIS — L97411 Non-pressure chronic ulcer of right heel and midfoot limited to breakdown of skin: Secondary | ICD-10-CM

## 2019-03-02 DIAGNOSIS — E08621 Diabetes mellitus due to underlying condition with foot ulcer: Secondary | ICD-10-CM | POA: Diagnosis not present

## 2019-03-02 DIAGNOSIS — E1122 Type 2 diabetes mellitus with diabetic chronic kidney disease: Secondary | ICD-10-CM

## 2019-03-02 DIAGNOSIS — N186 End stage renal disease: Secondary | ICD-10-CM

## 2019-03-10 ENCOUNTER — Other Ambulatory Visit: Payer: Medicare Other | Admitting: Orthotics

## 2019-03-23 ENCOUNTER — Ambulatory Visit: Payer: Medicare Other | Admitting: Podiatry

## 2019-03-25 ENCOUNTER — Ambulatory Visit (INDEPENDENT_AMBULATORY_CARE_PROVIDER_SITE_OTHER): Payer: Medicare Other

## 2019-03-25 ENCOUNTER — Other Ambulatory Visit: Payer: Self-pay

## 2019-03-25 ENCOUNTER — Ambulatory Visit: Payer: Medicare Other | Admitting: Orthotics

## 2019-03-25 ENCOUNTER — Ambulatory Visit (INDEPENDENT_AMBULATORY_CARE_PROVIDER_SITE_OTHER): Payer: Medicare Other | Admitting: Podiatry

## 2019-03-25 DIAGNOSIS — T148XXA Other injury of unspecified body region, initial encounter: Secondary | ICD-10-CM

## 2019-03-25 DIAGNOSIS — Z89412 Acquired absence of left great toe: Secondary | ICD-10-CM | POA: Diagnosis not present

## 2019-03-25 DIAGNOSIS — S99919A Unspecified injury of unspecified ankle, initial encounter: Secondary | ICD-10-CM

## 2019-03-25 DIAGNOSIS — S99912A Unspecified injury of left ankle, initial encounter: Secondary | ICD-10-CM | POA: Diagnosis not present

## 2019-03-25 DIAGNOSIS — E08621 Diabetes mellitus due to underlying condition with foot ulcer: Secondary | ICD-10-CM

## 2019-03-25 DIAGNOSIS — S82842A Displaced bimalleolar fracture of left lower leg, initial encounter for closed fracture: Secondary | ICD-10-CM

## 2019-03-25 DIAGNOSIS — B351 Tinea unguium: Secondary | ICD-10-CM | POA: Diagnosis not present

## 2019-03-25 DIAGNOSIS — L97411 Non-pressure chronic ulcer of right heel and midfoot limited to breakdown of skin: Secondary | ICD-10-CM

## 2019-03-25 NOTE — Progress Notes (Signed)
Subjective:  Patient ID: John Ortiz, male    DOB: [birth year],  MRN: [id number]  Chief Complaint  Patient presents with  . Nail Problem    9 week nail trim   73 y.o. male presents with the above complaint.  States that he fell last Tuesday while raking leaves.  States that he subsequently fell again.  Has had swelling and pain in the left foot ever since.  Has been walking on it.  Otherwise here for routine foot care services.  Review of Systems: Negative except as noted in the HPI. Denies N/V/F/Ch.  Past Medical History:  Diagnosis Date  . Anemia in CKD (chronic kidney disease)   . Aneurysm (HCC)    Left arm  . Arthritis   . CHF (congestive heart failure) ([place])   . Chronic kidney disease, stage V ([place])    MWF- [place]  . Coagulation defect ([place])   . Coronary artery disease   . Diabetes mellitus type 2, uncomplicated ([place])   . Diarrhea   . Fluid overload   . GERD (gastroesophageal reflux disease)   . Heart murmur   . Hypercalcemia   . Iron deficiency anemia, unspecified   . Non-healing ulcer of foot ([place])    Right foot  . Pain   . Pruritus   . Renal osteodystrophy   . Secondary hyperparathyroidism of renal origin ([place])   . Shortness of breath   . Type 2 diabetes mellitus with hypoglycemia without coma ([place])   . Unspecified protein-calorie malnutrition ([place])     Current Outpatient Medications:  .  glucosamine-chondroitin 500-400 MG tablet, Take by mouth., Disp: , Rfl:  .  acetaminophen (TYLENOL) 500 MG tablet, Take 1,000 mg by mouth at bedtime as needed for moderate pain., Disp: , Rfl:  .  aspirin EC 81 MG tablet, Take 81 mg by mouth at bedtime. , Disp: , Rfl:  .  B Complex-C-Folic Acid (DIALYVITE 449) 0.8 MG TABS, Take 1 tablet by mouth daily with lunch. , Disp: , Rfl:  .  calcium acetate (PHOSLO) 667 MG capsule, Take 2,001 mg by mouth 3 (three) times daily with meals., Disp: , Rfl:  .  clopidogrel (PLAVIX) 75 MG tablet, Take 1 tablet (75 mg total) by mouth  daily. (Patient taking differently: Take 75 mg by mouth daily with lunch. ), Disp: 30 tablet, Rfl: 11 .  FLUoxetine (PROZAC) 20 MG capsule, Take 20 mg by mouth daily., Disp: , Rfl:  .  furosemide (LASIX) 80 MG tablet, Take 80 mg by mouth 2 (two) times daily., Disp: , Rfl:  .  glucose 4 GM chewable tablet, Chew 1-5 tablets by mouth as needed for low blood sugar., Disp: , Rfl:  .  glucose blood (PRECISION XTRA TEST STRIPS) test strip, Use one strip to test blood sugar as directed, Disp: , Rfl:  .  insulin aspart (NOVOLOG FLEXPEN) 100 UNIT/ML FlexPen, Inject 7-15 Units into the skin 3 (three) times daily with meals. Sliding Scale Insulin, Disp: , Rfl:  .  insulin glargine (LANTUS) 100 UNIT/ML injection, Inject 8 Units into the skin 2 (two) times daily. , Disp: , Rfl:  .  Insulin Pen Needle (FIFTY50 PEN NEEDLES) 31G X 8 MM MISC, 1 each by Misc.(Non-Drug; Combo Route) route 3 (three) times daily as needed., Disp: , Rfl:  .  oxyCODONE (ROXICODONE) 5 MG immediate release tablet, Take 1 tablet (5 mg total) by mouth every 6 (six) hours as needed., Disp: 12 tablet, Rfl: 0 .  silver sulfADIAZINE (SILVADENE)  1 % cream, Apply pea-sized amount to wound daily. (Patient taking differently: Apply 1 application topically daily. Apply pea-sized amount to wound daily.), Disp: 50 g, Rfl: 0 .  simvastatin (ZOCOR) 40 MG tablet, Take 40 mg by mouth at bedtime., Disp: , Rfl:  .  TechLite Lancets MISC, 1 each by Misc.(Non-Drug; Combo Route) route daily., Disp: , Rfl:  .  vitamin B-12 (CYANOCOBALAMIN) 500 MCG tablet, Take 500 mcg by mouth daily., Disp: , Rfl:   Social History   Tobacco Use  Smoking Status Former Smoker  . Years: 15.00  . Quit date: 01/24/1989  . Years since quitting: 30.1  Smokeless Tobacco Never Used    Allergies  Allergen Reactions  . Promethazine Other (See Comments)    Tremors, cold, restlessness.  . Povidone-Iodine Rash   Objective:   There were no vitals filed for this visit. There is no  height or weight on file to calculate BMI. Constitutional Well developed. Well nourished.  Vascular Dorsalis pedis pulses barely palpable bilaterally. Posterior tibial pulses non palpable bilaterally. Capillary refill normal to all digits.  No cyanosis or clubbing noted. Pedal hair growth normal.  Neurologic Normal speech. Oriented to person, place, and time. Epicritic sensation to light touch grossly present bilaterally.  Dermatologic Nails normal Skin left plantar foot large blister without warmth erythema signs of infection, serous drainage under blister with redudant skin  Orthopedic:  Swelling and pitting edema to the left foot pedal patient with a left ankle both medially and laterally at both malleoli.   Radiographs: Taken and reviewed.  Minimal displaced fibular fracture.  Medial malleolus fracture with multiple fracture lines.  Fracture appears intimate to the patient's previous hardware. Assessment:   1. Bimalleolar fracture of left ankle, closed, initial encounter   2. Onychomycosis   3. History of amputation of left great toe [facility])    Plan:  Patient was evaluated and treated and all questions answered.  Bimalleolar fracture left foot -X-rays taken reviewed.  Bimalleolar fracture with periprosthetic type fracture medially.  While I am concerned about instability of this fracture pattern, patient is a poor surgical candidate due to incisional disease and vascular calcification.  Will trial aggressive non-operative therapy.  Soft cast consisting of Unna boot and [place] applied today.  Will transition to a hard cast next week if edema is reduced.  Discussed with patient that should the fracture appear to move would have to consider possible surgical intervention.  Would have to consider removal of the hardware should it back out. Would consider possible percutaneous fibular fixation.  Discussed strict nonweightbearing to the left lower extremity.  Patient has a wheelchair at home  that he states he can use.   Onychomycosis with amputation history  -Nails debrided x9 -Defer diabetic shoe casting given edema from the left fracture  No follow-ups on file.

## 2019-03-25 NOTE — Progress Notes (Signed)
Couldn't cast because Patient broke ankle.

## 2019-03-28 NOTE — Progress Notes (Signed)
Subjective:  Patient ID: John Ortiz, male    DOB: [birth year],  MRN: [id number]  Chief Complaint  Patient presents with  . Routine Post Op    the ball of the right foot is doing good and to have my feet checked    73 y.o. male presents with the above complaint. Hx as above. Thinks the blisters are doing much better.  Review of Systems: Negative except as noted in the HPI. Denies N/V/F/Ch.  Past Medical History:  Diagnosis Date  . Anemia in CKD (chronic kidney disease)   . Aneurysm (HCC)    Left arm  . Arthritis   . CHF (congestive heart failure) ([place])   . Chronic kidney disease, stage V ([place])    MWF- [place]  . Coagulation defect ([place])   . Coronary artery disease   . Diabetes mellitus type 2, uncomplicated ([place])   . Diarrhea   . Fluid overload   . GERD (gastroesophageal reflux disease)   . Heart murmur   . Hypercalcemia   . Iron deficiency anemia, unspecified   . Non-healing ulcer of foot ([place])    Right foot  . Pain   . Pruritus   . Renal osteodystrophy   . Secondary hyperparathyroidism of renal origin ([place])   . Shortness of breath   . Type 2 diabetes mellitus with hypoglycemia without coma ([place])   . Unspecified protein-calorie malnutrition ([place])     Current Outpatient Medications:  .  acetaminophen (TYLENOL) 500 MG tablet, Take 1,000 mg by mouth at bedtime as needed for moderate pain., Disp: , Rfl:  .  aspirin EC 81 MG tablet, Take 81 mg by mouth at bedtime. , Disp: , Rfl:  .  B Complex-C-Folic Acid (DIALYVITE 588) 0.8 MG TABS, Take 1 tablet by mouth daily with lunch. , Disp: , Rfl:  .  calcium acetate (PHOSLO) 667 MG capsule, Take 2,001 mg by mouth 3 (three) times daily with meals., Disp: , Rfl:  .  clopidogrel (PLAVIX) 75 MG tablet, Take 1 tablet (75 mg total) by mouth daily. (Patient taking differently: Take 75 mg by mouth daily with lunch. ), Disp: 30 tablet, Rfl: 11 .  FLUoxetine (PROZAC) 20 MG capsule, Take 20 mg by mouth daily., Disp: , Rfl:  .   furosemide (LASIX) 80 MG tablet, Take 80 mg by mouth 2 (two) times daily., Disp: , Rfl:  .  glucosamine-chondroitin 500-400 MG tablet, Take by mouth., Disp: , Rfl:  .  glucose 4 GM chewable tablet, Chew 1-5 tablets by mouth as needed for low blood sugar., Disp: , Rfl:  .  glucose blood (PRECISION XTRA TEST STRIPS) test strip, Use one strip to test blood sugar as directed, Disp: , Rfl:  .  insulin aspart (NOVOLOG FLEXPEN) 100 UNIT/ML FlexPen, Inject 7-15 Units into the skin 3 (three) times daily with meals. Sliding Scale Insulin, Disp: , Rfl:  .  insulin glargine (LANTUS) 100 UNIT/ML injection, Inject 8 Units into the skin 2 (two) times daily. , Disp: , Rfl:  .  Insulin Pen Needle (FIFTY50 PEN NEEDLES) 31G X 8 MM MISC, 1 each by Misc.(Non-Drug; Combo Route) route 3 (three) times daily as needed., Disp: , Rfl:  .  oxyCODONE (ROXICODONE) 5 MG immediate release tablet, Take 1 tablet (5 mg total) by mouth every 6 (six) hours as needed., Disp: 12 tablet, Rfl: 0 .  silver sulfADIAZINE (SILVADENE) 1 % cream, Apply pea-sized amount to wound daily. (Patient taking differently: Apply 1 application topically daily. Apply pea-sized amount  to wound daily.), Disp: 50 g, Rfl: 0 .  simvastatin (ZOCOR) 40 MG tablet, Take 40 mg by mouth at bedtime., Disp: , Rfl:  .  TechLite Lancets MISC, 1 each by Misc.(Non-Drug; Combo Route) route daily., Disp: , Rfl:  .  vitamin B-12 (CYANOCOBALAMIN) 500 MCG tablet, Take 500 mcg by mouth daily., Disp: , Rfl:   Social History   Tobacco Use  Smoking Status Former Smoker  . Years: 15.00  . Quit date: 01/24/1989  . Years since quitting: 30.1  Smokeless Tobacco Never Used    Allergies  Allergen Reactions  . Promethazine Other (See Comments)    Tremors, cold, restlessness.  . Povidone-Iodine Rash   Objective:   Vitals:   03/02/19 1015  Temp: (!) 97.4 F (36.3 C)   There is no height or weight on file to calculate BMI. Constitutional Well developed. Well nourished.   Vascular Dorsalis pedis pulses barely palpable bilaterally. Posterior tibial pulses non palpable bilaterally. Capillary refill normal to all digits.  No cyanosis or clubbing noted. Pedal hair growth normal.  Neurologic Normal speech. Oriented to person, place, and time. Epicritic sensation to light touch grossly present bilaterally.  Dermatologic Nails normal Skin left plantar foot healing well. No open wound.  Orthopedic: Normal joint ROM without pain or crepitus bilaterally.   Radiographs: No fractures or osseous erosions Assessment:   1. Diabetic ulcer of right midfoot associated with diabetes mellitus due to underlying condition, limited to breakdown of skin ([place])   2. DM type 2 with diabetic peripheral neuropathy ([place])   3. DM type 2 causing ESRD ([place])    Plan:  Patient was evaluated and treated and all questions answered.  Blister left plantar foot -Healing well, redundant skin removed. No open wound.  DM with ESRD -Would benefit from DM shoes -Will make appt for DM shoes -Too early for RFC  No follow-ups on file.

## 2019-04-02 ENCOUNTER — Telehealth: Payer: Self-pay | Admitting: Podiatry

## 2019-04-02 NOTE — Telephone Encounter (Signed)
Pt states he has not heard anything from home health. Pt is requesting a follow up phone call to know the status.

## 2019-04-04 NOTE — Telephone Encounter (Signed)
Can you look into this?

## 2019-04-06 ENCOUNTER — Other Ambulatory Visit: Payer: Self-pay | Admitting: Podiatry

## 2019-04-06 ENCOUNTER — Telehealth: Payer: Self-pay | Admitting: *Deleted

## 2019-04-06 ENCOUNTER — Other Ambulatory Visit: Payer: Self-pay

## 2019-04-06 ENCOUNTER — Ambulatory Visit (INDEPENDENT_AMBULATORY_CARE_PROVIDER_SITE_OTHER): Payer: Medicare Other | Admitting: Podiatry

## 2019-04-06 ENCOUNTER — Ambulatory Visit (INDEPENDENT_AMBULATORY_CARE_PROVIDER_SITE_OTHER): Payer: Medicare Other

## 2019-04-06 DIAGNOSIS — S72114A Nondisplaced fracture of greater trochanter of right femur, initial encounter for closed fracture: Secondary | ICD-10-CM | POA: Diagnosis not present

## 2019-04-06 DIAGNOSIS — M79672 Pain in left foot: Secondary | ICD-10-CM

## 2019-04-06 DIAGNOSIS — S82842A Displaced bimalleolar fracture of left lower leg, initial encounter for closed fracture: Secondary | ICD-10-CM | POA: Diagnosis not present

## 2019-04-06 NOTE — Progress Notes (Signed)
Subjective:  Patient ID: John Ortiz, male    DOB: 15-Aug-1946,  MRN: [id number]  No chief complaint on file.  73 y.o. male presents with the above complaint.  States he fell the same day, was seen at [facility] ER, diagnosed with a right hip fracture.  Review of Systems: Negative except as noted in the HPI. Denies N/V/F/Ch.  Past Medical History:  Diagnosis Date  . Anemia in CKD (chronic kidney disease)   . Aneurysm (HCC)    Left arm  . Arthritis   . CHF (congestive heart failure) ([place])   . Chronic kidney disease, stage V ([place])    MWF- [place]  . Coagulation defect ([place])   . Coronary artery disease   . Diabetes mellitus type 2, uncomplicated ([place])   . Diarrhea   . Fluid overload   . GERD (gastroesophageal reflux disease)   . Heart murmur   . Hypercalcemia   . Iron deficiency anemia, unspecified   . Non-healing ulcer of foot ([place])    Right foot  . Pain   . Pruritus   . Renal osteodystrophy   . Secondary hyperparathyroidism of renal origin ([place])   . Shortness of breath   . Type 2 diabetes mellitus with hypoglycemia without coma ([place])   . Unspecified protein-calorie malnutrition ([place])     Current Outpatient Medications:  .  acetaminophen (TYLENOL) 500 MG tablet, Take 1,000 mg by mouth at bedtime as needed for moderate pain., Disp: , Rfl:  .  aspirin EC 81 MG tablet, Take 81 mg by mouth at bedtime. , Disp: , Rfl:  .  B Complex-C-Folic Acid (DIALYVITE 622) 0.8 MG TABS, Take 1 tablet by mouth daily with lunch. , Disp: , Rfl:  .  calcium acetate (PHOSLO) 667 MG capsule, Take 2,001 mg by mouth 3 (three) times daily with meals., Disp: , Rfl:  .  clopidogrel (PLAVIX) 75 MG tablet, Take 1 tablet (75 mg total) by mouth daily. (Patient taking differently: Take 75 mg by mouth daily with lunch. ), Disp: 30 tablet, Rfl: 11 .  FLUoxetine (PROZAC) 20 MG capsule, Take 20 mg by mouth daily., Disp: , Rfl:  .  furosemide (LASIX) 80 MG tablet, Take 80 mg by mouth 2 (two) times daily.,  Disp: , Rfl:  .  glucosamine-chondroitin 500-400 MG tablet, Take by mouth., Disp: , Rfl:  .  glucose 4 GM chewable tablet, Chew 1-5 tablets by mouth as needed for low blood sugar., Disp: , Rfl:  .  glucose blood (PRECISION XTRA TEST STRIPS) test strip, Use one strip to test blood sugar as directed, Disp: , Rfl:  .  insulin aspart (NOVOLOG FLEXPEN) 100 UNIT/ML FlexPen, Inject 7-15 Units into the skin 3 (three) times daily with meals. Sliding Scale Insulin, Disp: , Rfl:  .  insulin glargine (LANTUS) 100 UNIT/ML injection, Inject 8 Units into the skin 2 (two) times daily. , Disp: , Rfl:  .  Insulin Pen Needle (FIFTY50 PEN NEEDLES) 31G X 8 MM MISC, 1 each by Misc.(Non-Drug; Combo Route) route 3 (three) times daily as needed., Disp: , Rfl:  .  oxyCODONE (ROXICODONE) 5 MG immediate release tablet, Take 1 tablet (5 mg total) by mouth every 6 (six) hours as needed., Disp: 12 tablet, Rfl: 0 .  silver sulfADIAZINE (SILVADENE) 1 % cream, Apply pea-sized amount to wound daily. (Patient taking differently: Apply 1 application topically daily. Apply pea-sized amount to wound daily.), Disp: 50 g, Rfl: 0 .  simvastatin (ZOCOR) 40 MG tablet, Take 40 mg  by mouth at bedtime., Disp: , Rfl:  .  TechLite Lancets MISC, 1 each by Misc.(Non-Drug; Combo Route) route daily., Disp: , Rfl:  .  vitamin B-12 (CYANOCOBALAMIN) 500 MCG tablet, Take 500 mcg by mouth daily., Disp: , Rfl:   Social History   Tobacco Use  Smoking Status Former Smoker  . Years: 15.00  . Quit date: 01/24/1989  . Years since quitting: 30.2  Smokeless Tobacco Never Used    Allergies  Allergen Reactions  . Promethazine Other (See Comments)    Tremors, cold, restlessness.  . Povidone-Iodine Rash   Objective:   There were no vitals filed for this visit. There is no height or weight on file to calculate BMI. Constitutional Well developed. Well nourished.  Vascular Dorsalis pedis pulses barely palpable bilaterally. Posterior tibial pulses non  palpable bilaterally. Capillary refill normal to all digits.  No cyanosis or clubbing noted. Pedal hair growth normal.  Neurologic Normal speech. Oriented to person, place, and time. Epicritic sensation to light touch grossly present bilaterally.  Dermatologic Nails normal Skin left plantar foot large blister without warmth erythema signs of infection, serous drainage under blister with redudant skin  Orthopedic:  Swelling and pitting edema to the left foot pain to palpation about the left ankle both medially and laterally at both malleoli.   Radiographs: Again seen minimally displaced bimalleolar ankle fracture. Periprosthetic component medial malleolus.  Assessment:   1. Bimalleolar fracture of left ankle, closed, initial encounter   2. Nondisplaced fracture of greater trochanter of right femur, initial encounter for closed fracture [facility])    Plan:  Patient was evaluated and treated and all questions answered.  Bimalleolar fracture left foot -New XR today. Still minimally displaced, suboptimal views today from patient positioning. Acceptable alignment. -Though this is an unstable fracture pattern, he is at high risk of complications with surgery given hx of amputation, DM, PAD, ESRD on HD. Will continue plan for non-operative care at this time. -Would benefit from wheelchair. See below. -Hard cast applied LLE with 3 rolls of fiberglass casting material.  R Greater Trochanter Fracture -CT from [facility] reviewed. -Patient has been NWB but does not have a wheelchair. -Will Refer to Dr. Joie Bimler for evaluation.  As patient is currently NWB, would benefit from wheelchair. Patient suffers from left ankle fracture, right hip fracture which impairs their ability to perform daily activities like bathing and dressing in the home.  A walker will not resolve issue with performing activities of daily living. A wheelchair will allow patient to safely perform daily activities. Patient can safely propel the  wheelchair in the home or has a caregiver who can provide assistance. Length of need 6 months . Accessories: elevating leg rests (ELRs), wheel locks, extensions and anti-tippers.   No follow-ups on file.

## 2019-04-06 NOTE — Telephone Encounter (Signed)
Faxed required form, clinicals and demographics to [employer] and emailed to M. Stenson, A. Catron.

## 2019-04-13 ENCOUNTER — Other Ambulatory Visit: Payer: Self-pay

## 2019-04-13 ENCOUNTER — Ambulatory Visit (INDEPENDENT_AMBULATORY_CARE_PROVIDER_SITE_OTHER): Payer: Medicare Other | Admitting: Podiatry

## 2019-04-13 ENCOUNTER — Ambulatory Visit (INDEPENDENT_AMBULATORY_CARE_PROVIDER_SITE_OTHER): Payer: Medicare Other

## 2019-04-13 ENCOUNTER — Telehealth: Payer: Self-pay | Admitting: *Deleted

## 2019-04-13 DIAGNOSIS — L97512 Non-pressure chronic ulcer of other part of right foot with fat layer exposed: Secondary | ICD-10-CM | POA: Diagnosis not present

## 2019-04-13 DIAGNOSIS — S82842A Displaced bimalleolar fracture of left lower leg, initial encounter for closed fracture: Secondary | ICD-10-CM

## 2019-04-13 NOTE — Telephone Encounter (Signed)
[facility] request if Dr. March Rummage with sign the plan of care orders and if could have PT only.

## 2019-04-13 NOTE — Telephone Encounter (Signed)
Approve PT eval and treat. NWB LLE

## 2019-04-13 NOTE — Telephone Encounter (Signed)
I called [facility] and asked if [facility] was to be for PT only and she stated yes. I told pt I would get orders from Dr. March Rummage.

## 2019-04-14 NOTE — Telephone Encounter (Signed)
I called [facility] and informed of Dr. March Rummage PT orders.

## 2019-04-20 ENCOUNTER — Other Ambulatory Visit: Payer: Self-pay

## 2019-04-20 ENCOUNTER — Telehealth: Payer: Self-pay | Admitting: *Deleted

## 2019-04-20 ENCOUNTER — Other Ambulatory Visit: Payer: Self-pay | Admitting: Podiatry

## 2019-04-20 ENCOUNTER — Ambulatory Visit (INDEPENDENT_AMBULATORY_CARE_PROVIDER_SITE_OTHER): Payer: Medicare Other

## 2019-04-20 ENCOUNTER — Ambulatory Visit (INDEPENDENT_AMBULATORY_CARE_PROVIDER_SITE_OTHER): Payer: Medicare Other | Admitting: Podiatry

## 2019-04-20 DIAGNOSIS — I739 Peripheral vascular disease, unspecified: Secondary | ICD-10-CM

## 2019-04-20 DIAGNOSIS — S9305XA Dislocation of left ankle joint, initial encounter: Secondary | ICD-10-CM

## 2019-04-20 DIAGNOSIS — N186 End stage renal disease: Secondary | ICD-10-CM

## 2019-04-20 DIAGNOSIS — S82842A Displaced bimalleolar fracture of left lower leg, initial encounter for closed fracture: Secondary | ICD-10-CM

## 2019-04-20 DIAGNOSIS — Z992 Dependence on renal dialysis: Secondary | ICD-10-CM

## 2019-04-20 DIAGNOSIS — E0851 Diabetes mellitus due to underlying condition with diabetic peripheral angiopathy without gangrene: Secondary | ICD-10-CM

## 2019-04-20 DIAGNOSIS — I25810 Atherosclerosis of coronary artery bypass graft(s) without angina pectoris: Secondary | ICD-10-CM

## 2019-04-20 DIAGNOSIS — Z794 Long term (current) use of insulin: Secondary | ICD-10-CM

## 2019-04-20 MED ORDER — OXYCODONE HCL 5 MG PO TABS: 5.0000 mg | ORAL_TABLET | Freq: Four times a day (QID) | ORAL | 0 refills | Status: DC | PRN

## 2019-04-20 NOTE — Patient Instructions (Signed)
Pre-Operative Instructions  Congratulations, you have decided to take an important step towards improving your quality of life.  You can be assured that the doctors and staff at [facility] will be with you every step of the way.  Here are some important things you should know:  1. Plan to be at the surgery center/hospital at least 1 (one) hour prior to your scheduled time, unless otherwise directed by the surgical center/hospital staff.  You must have a responsible adult accompany you, remain during the surgery and drive you home.  Make sure you have directions to the surgical center/hospital to ensure you arrive on time. 2. If you are having surgery at [facility] or [facility] hospitals, you will need a copy of your medical history and physical form from your family physician within one month prior to the date of surgery. We will give you a form for your primary physician to complete.  3. We make every effort to accommodate the date you request for surgery.  However, there are times where surgery dates or times have to be moved.  We will contact you as soon as possible if a change in schedule is required.   4. No aspirin/ibuprofen for one week before surgery.  If you are on aspirin, any non-steroidal anti-inflammatory medications (Mobic, Aleve, Ibuprofen) should not be taken seven (7) days prior to your surgery.  You make take Tylenol for pain prior to surgery.  5. Medications - If you are taking daily heart and blood pressure medications, seizure, reflux, allergy, asthma, anxiety, pain or diabetes medications, make sure you notify the surgery center/hospital before the day of surgery so they can tell you which medications you should take or avoid the day of surgery. 6. No food or drink after midnight the night before surgery unless directed otherwise by surgical center/hospital staff. 7. No alcoholic beverages 24-hours prior to surgery.  No smoking 24-hours prior or 24-hours after  surgery. 8. Wear loose pants or shorts. They should be loose enough to fit over bandages, boots, and casts. 9. Don't wear slip-on shoes. Sneakers are preferred. 10. Bring your boot with you to the surgery center/hospital.  Also bring crutches or a walker if your physician has prescribed it for you.  If you do not have this equipment, it will be provided for you after surgery. 11. If you have not been contacted by the surgery center/hospital by the day before your surgery, call to confirm the date and time of your surgery. 12. Leave-time from work may vary depending on the type of surgery you have.  Appropriate arrangements should be made prior to surgery with your employer. 13. Prescriptions will be provided immediately following surgery by your doctor.  Fill these as soon as possible after surgery and take the medication as directed. Pain medications will not be refilled on weekends and must be approved by the doctor. 14. Remove nail polish on the operative foot and avoid getting pedicures prior to surgery. 15. Wash the night before surgery.  The night before surgery wash the foot and leg well with water and the antibacterial soap provided. Be sure to pay special attention to beneath the toenails and in between the toes.  Wash for at least three (3) minutes. Rinse thoroughly with water and dry well with a towel.  Perform this wash unless told not to do so by your physician.  Enclosed: 1 Ice pack (please put in freezer the night before surgery)   1 Hibiclens skin cleaner     Pre-op instructions  If you have any questions regarding the instructions, please do not hesitate to call our office.  [place]: [address], [place], [place] [address] -- [phone]  [place]: [address] [place], [place] [address] -- [phone]  [place]: [address]  [place], [place] [address] -- [phone]  [place]: [address], [address], [place], [place] [address] -- [phone]  Website:  [email or web address] 

## 2019-04-20 NOTE — Telephone Encounter (Signed)
"  I am out of the office of Hardie Pulley.  He asked me to call you and gave me your card to schedule an appointment for my left foot.  I'll get back to you.  Have a great day."

## 2019-04-21 ENCOUNTER — Telehealth: Payer: Self-pay | Admitting: *Deleted

## 2019-04-21 DIAGNOSIS — S82899A Other fracture of unspecified lower leg, initial encounter for closed fracture: Secondary | ICD-10-CM

## 2019-04-21 NOTE — Telephone Encounter (Signed)
-----   Message from Evelina Bucy, DPM sent at 04/20/2019  5:00 PM EDT ----- Can we order STAT CT at [place]. Dx: Left ankle fracture

## 2019-04-21 NOTE — Telephone Encounter (Signed)
"  I'm calling on behalf of John Ortiz.  This is his wife.  He's supposed to schedule an appointment to have surgery on his foot with Dr. March Rummage.  We'll be in and out all day if you want to call back."  I left them a message that I was scheduling his surgery for 04/27/2019.  I explained that was the date that Dr. March Rummage had written down on his scheduling sheet.  I asked him to call if that's not a good date for him.  I also informed him that he will need a history and physical form completed by his primary care physician.  The visit must be within a 30 day window before his surgery date.

## 2019-04-21 NOTE — Telephone Encounter (Signed)
I informed pt's wife, Santiago Glad of [facility] CT appt 04/22/2019.

## 2019-04-21 NOTE — Telephone Encounter (Deleted)
-----   Message from Evelina Bucy, DPM sent at 04/20/2019  5:00 PM EDT ----- Can we order STAT CT at [place]. Dx: Left ankle fracture

## 2019-04-21 NOTE — Telephone Encounter (Signed)
[facility] Jacob Moores scheduled pt for CT left ankle [address] at [facility] on 04/22/2019 arrive 4:00pm for 4:30pm imaging. Faxed orders to [place].

## 2019-04-23 ENCOUNTER — Telehealth: Payer: Self-pay | Admitting: *Deleted

## 2019-04-23 NOTE — Telephone Encounter (Signed)
Entered in error

## 2019-04-26 ENCOUNTER — Other Ambulatory Visit: Payer: Self-pay | Admitting: Podiatry

## 2019-04-26 ENCOUNTER — Other Ambulatory Visit: Payer: Self-pay

## 2019-04-26 ENCOUNTER — Ambulatory Visit (INDEPENDENT_AMBULATORY_CARE_PROVIDER_SITE_OTHER): Payer: Medicare Other | Admitting: Podiatry

## 2019-04-26 DIAGNOSIS — S82842A Displaced bimalleolar fracture of left lower leg, initial encounter for closed fracture: Secondary | ICD-10-CM | POA: Diagnosis not present

## 2019-04-26 DIAGNOSIS — I25810 Atherosclerosis of coronary artery bypass graft(s) without angina pectoris: Secondary | ICD-10-CM

## 2019-04-26 DIAGNOSIS — I739 Peripheral vascular disease, unspecified: Secondary | ICD-10-CM

## 2019-04-26 DIAGNOSIS — N186 End stage renal disease: Secondary | ICD-10-CM

## 2019-04-26 DIAGNOSIS — M79672 Pain in left foot: Secondary | ICD-10-CM

## 2019-04-26 DIAGNOSIS — Z794 Long term (current) use of insulin: Secondary | ICD-10-CM

## 2019-04-26 DIAGNOSIS — Z992 Dependence on renal dialysis: Secondary | ICD-10-CM

## 2019-04-26 DIAGNOSIS — E0851 Diabetes mellitus due to underlying condition with diabetic peripheral angiopathy without gangrene: Secondary | ICD-10-CM | POA: Diagnosis not present

## 2019-04-26 NOTE — Progress Notes (Signed)
Subjective:  Patient ID: John Ortiz, male    DOB: Nov 13, 1945,  MRN: [id number]  No chief complaint on file.  73 y.o. male presents for f/u of left ankle fracture. Reports more compliance with NWB status. Saw orthopedics - states no surgery is planned. Understands plan for surgery tomorrow.  Review of Systems: Negative except as noted in the HPI. Denies N/V/F/Ch.  Past Medical History:  Diagnosis Date  . Anemia in CKD (chronic kidney disease)   . Aneurysm (HCC)    Left arm  . Arthritis   . CHF (congestive heart failure) ([place])   . Chronic kidney disease, stage V ([place])    MWF- [place]  . Coagulation defect ([place])   . Coronary artery disease   . Diabetes mellitus type 2, uncomplicated ([place])   . Diarrhea   . Fluid overload   . GERD (gastroesophageal reflux disease)   . Heart murmur   . Hypercalcemia   . Iron deficiency anemia, unspecified   . Non-healing ulcer of foot ([place])    Right foot  . Pain   . Pruritus   . Renal osteodystrophy   . Secondary hyperparathyroidism of renal origin ([place])   . Shortness of breath   . Type 2 diabetes mellitus with hypoglycemia without coma ([place])   . Unspecified protein-calorie malnutrition ([place])     Current Outpatient Medications:  .  acetaminophen (TYLENOL) 500 MG tablet, Take 1,000 mg by mouth at bedtime as needed for moderate pain., Disp: , Rfl:  .  aspirin EC 81 MG tablet, Take 81 mg by mouth at bedtime. , Disp: , Rfl:  .  B Complex-C-Folic Acid (DIALYVITE 086) 0.8 MG TABS, Take 1 tablet by mouth daily with lunch. , Disp: , Rfl:  .  calcium acetate (PHOSLO) 667 MG capsule, Take 2,001 mg by mouth 3 (three) times daily with meals., Disp: , Rfl:  .  clopidogrel (PLAVIX) 75 MG tablet, Take 1 tablet (75 mg total) by mouth daily. (Patient taking differently: Take 75 mg by mouth daily with lunch. ), Disp: 30 tablet, Rfl: 11 .  FLUoxetine (PROZAC) 20 MG capsule, Take 20 mg by mouth daily., Disp: , Rfl:  .  furosemide (LASIX) 80 MG tablet,  Take 80 mg by mouth 2 (two) times daily., Disp: , Rfl:  .  glucosamine-chondroitin 500-400 MG tablet, Take by mouth., Disp: , Rfl:  .  glucose 4 GM chewable tablet, Chew 1-5 tablets by mouth as needed for low blood sugar., Disp: , Rfl:  .  glucose blood (PRECISION XTRA TEST STRIPS) test strip, Use one strip to test blood sugar as directed, Disp: , Rfl:  .  insulin aspart (NOVOLOG FLEXPEN) 100 UNIT/ML FlexPen, Inject 7-15 Units into the skin 3 (three) times daily with meals. Sliding Scale Insulin, Disp: , Rfl:  .  insulin glargine (LANTUS) 100 UNIT/ML injection, Inject 8 Units into the skin 2 (two) times daily. , Disp: , Rfl:  .  oxyCODONE (ROXICODONE) 5 MG immediate release tablet, Take 1 tablet (5 mg total) by mouth every 6 (six) hours as needed., Disp: 12 tablet, Rfl: 0 .  silver sulfADIAZINE (SILVADENE) 1 % cream, Apply pea-sized amount to wound daily. (Patient taking differently: Apply 1 application topically daily. Apply pea-sized amount to wound daily.), Disp: 50 g, Rfl: 0 .  simvastatin (ZOCOR) 40 MG tablet, Take 40 mg by mouth at bedtime., Disp: , Rfl:  .  TechLite Lancets MISC, 1 each by Misc.(Non-Drug; Combo Route) route daily., Disp: , Rfl:  .  traMADol (ULTRAM) 50 MG tablet, Take by mouth., Disp: , Rfl:  .  vitamin B-12 (CYANOCOBALAMIN) 500 MCG tablet, Take 500 mcg by mouth daily., Disp: , Rfl:   Social History   Tobacco Use  Smoking Status Former Smoker  . Years: 15.00  . Quit date: 01/24/1989  . Years since quitting: 30.2  Smokeless Tobacco Never Used    Allergies  Allergen Reactions  . Promethazine Other (See Comments)    Tremors, cold, restlessness.  . Povidone-Iodine Rash   Objective:   There were no vitals filed for this visit. There is no height or weight on file to calculate BMI. Constitutional Well developed. Well nourished.  Vascular Dorsalis pedis pulses barely palpable bilaterally. Posterior tibial pulses non palpable bilaterally. Capillary refill normal to  all digits.  No cyanosis or clubbing noted. Pedal hair growth normal.  Neurologic Normal speech. Oriented to person, place, and time. Epicritic sensation to light touch grossly present bilaterally.  Dermatologic Nails normal Skin intact left lower extremity.  Orthopedic: Swelling and pitting edema to the left foot pain to palpation about the left ankle both medially and laterally at both malleoli.   Radiographs: Taken and reviewed.  Worsening displacement of the fracture now with dislocation of the ankle joint.  Suboptimal views. Assessment:   1. Bimalleolar fracture of left ankle, closed, initial encounter   2. ESRD (end stage renal disease) on dialysis ([place])   3. Diabetes mellitus due to underlying condition with diabetic peripheral angiopathy without gangrene, with long-term current use of insulin ([place])   4. 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 PAD (peripheral artery disease) ([place])    Plan:  Patient was evaluated and treated and all questions answered.  Bimalleolar fracture left foot, now with visual displacement. -Splint temporarily removed. No skin breakdown noted. Reapplied splint. -Proceed with planned surgery tomorrow. Discussed with patient proceeding with likely external fixator however he is consented for both internal and external fixation. Patient understands plan. Understands he could be in a frame for an extended period.  -Patient demonstrated he was able to transfer today.  R Greater Trochanter Fracture -Per patient no surgical intervention planned.   No follow-ups on file.

## 2019-04-26 NOTE — Telephone Encounter (Signed)
"  I'm calling in regards to my husband, John Ortiz.  You did call and leave a message and said that he's scheduled for surgery tomorrow.  I don't know which hospital.  I don't know what time.  I haven't heard from anybody else.  Could you call me?"  I'm returning your call.  "Thank you but someone from the hospital called me this morning.  We're playing phone tag though.  Thanks for call me back."

## 2019-04-26 NOTE — Progress Notes (Signed)
Subjective:  Patient ID: John Ortiz, male    DOB: [birth year],  MRN: [id number]  No chief complaint on file.  73 y.o. male presents for f/u of left ankle fracture. Admits to walking occasionally on the fracture despite NWB precautions. Presents today in his wheelchair. Still having a lot of pain in the ankle. Finding it difficult not to walk on the ankle due to also having pain to the right head.  Has not followed up with orthopedics yet.  Review of Systems: Negative except as noted in the HPI. Denies N/V/F/Ch.  Past Medical History:  Diagnosis Date  . Anemia in CKD (chronic kidney disease)   . Aneurysm (HCC)    Left arm  . Arthritis   . CHF (congestive heart failure) ([place])   . Chronic kidney disease, stage V ([place])    MWF- [place]  . Coagulation defect ([place])   . Coronary artery disease   . Diabetes mellitus type 2, uncomplicated ([place])   . Diarrhea   . Fluid overload   . GERD (gastroesophageal reflux disease)   . Heart murmur   . Hypercalcemia   . Iron deficiency anemia, unspecified   . Non-healing ulcer of foot ([place])    Right foot  . Pain   . Pruritus   . Renal osteodystrophy   . Secondary hyperparathyroidism of renal origin ([place])   . Shortness of breath   . Type 2 diabetes mellitus with hypoglycemia without coma ([place])   . Unspecified protein-calorie malnutrition ([place])     Current Outpatient Medications:  .  acetaminophen (TYLENOL) 500 MG tablet, Take 1,000 mg by mouth at bedtime as needed for moderate pain., Disp: , Rfl:  .  aspirin EC 81 MG tablet, Take 81 mg by mouth at bedtime. , Disp: , Rfl:  .  B Complex-C-Folic Acid (DIALYVITE 867) 0.8 MG TABS, Take 1 tablet by mouth daily with lunch. , Disp: , Rfl:  .  calcium acetate (PHOSLO) 667 MG capsule, Take 2,001 mg by mouth 3 (three) times daily with meals., Disp: , Rfl:  .  clopidogrel (PLAVIX) 75 MG tablet, Take 1 tablet (75 mg total) by mouth daily. (Patient taking differently: Take 75 mg by mouth daily with  lunch. ), Disp: 30 tablet, Rfl: 11 .  FLUoxetine (PROZAC) 20 MG capsule, Take 20 mg by mouth daily., Disp: , Rfl:  .  furosemide (LASIX) 80 MG tablet, Take 80 mg by mouth 2 (two) times daily., Disp: , Rfl:  .  glucosamine-chondroitin 500-400 MG tablet, Take by mouth., Disp: , Rfl:  .  glucose 4 GM chewable tablet, Chew 1-5 tablets by mouth as needed for low blood sugar., Disp: , Rfl:  .  glucose blood (PRECISION XTRA TEST STRIPS) test strip, Use one strip to test blood sugar as directed, Disp: , Rfl:  .  insulin aspart (NOVOLOG FLEXPEN) 100 UNIT/ML FlexPen, Inject 7-15 Units into the skin 3 (three) times daily with meals. Sliding Scale Insulin, Disp: , Rfl:  .  insulin glargine (LANTUS) 100 UNIT/ML injection, Inject 8 Units into the skin 2 (two) times daily. , Disp: , Rfl:  .  oxyCODONE (ROXICODONE) 5 MG immediate release tablet, Take 1 tablet (5 mg total) by mouth every 6 (six) hours as needed., Disp: 12 tablet, Rfl: 0 .  silver sulfADIAZINE (SILVADENE) 1 % cream, Apply pea-sized amount to wound daily. (Patient taking differently: Apply 1 application topically daily. Apply pea-sized amount to wound daily.), Disp: 50 g, Rfl: 0 .  simvastatin (ZOCOR) 40 MG  tablet, Take 40 mg by mouth at bedtime., Disp: , Rfl:  .  TechLite Lancets MISC, 1 each by Misc.(Non-Drug; Combo Route) route daily., Disp: , Rfl:  .  traMADol (ULTRAM) 50 MG tablet, Take by mouth., Disp: , Rfl:  .  vitamin B-12 (CYANOCOBALAMIN) 500 MCG tablet, Take 500 mcg by mouth daily., Disp: , Rfl:   Social History   Tobacco Use  Smoking Status Former Smoker  . Years: 15.00  . Quit date: 01/24/1989  . Years since quitting: 30.2  Smokeless Tobacco Never Used    Allergies  Allergen Reactions  . Promethazine Other (See Comments)    Tremors, cold, restlessness.  . Povidone-Iodine Rash   Objective:   There were no vitals filed for this visit. There is no height or weight on file to calculate BMI. Constitutional Well developed. Well  nourished.  Vascular Dorsalis pedis pulses barely palpable bilaterally. Posterior tibial pulses non palpable bilaterally. Capillary refill normal to all digits.  No cyanosis or clubbing noted. Pedal hair growth normal.  Neurologic Normal speech. Oriented to person, place, and time. Epicritic sensation to light touch grossly present bilaterally.  Dermatologic Nails normal Skin left plantar foot large blister without warmth erythema signs of infection, serous drainage under blister with redudant skin  Orthopedic:  Swelling and pitting edema to the left foot pain to palpation about the left ankle both medially and laterally at both malleoli.   Radiographs: Taken and reviewed.  Worsening displacement of the fracture now with dislocation of the ankle joint.  Suboptimal views. Assessment:   1. Bimalleolar fracture of left ankle, closed, initial encounter   2. Ankle dislocation, left, initial encounter   3. Diabetes mellitus due to underlying condition with diabetic peripheral angiopathy without gangrene, with long-term current use of insulin ([place])   4. ESRD (end stage renal disease) on dialysis ([place])   5. Coronary artery disease involving coronary bypass graft of native heart without angina pectoris   6. PAD (peripheral artery disease) ([place])    Plan:  Patient was evaluated and treated and all questions answered.  Bimalleolar fracture left foot, now with visual displacement. -New x-rays taken today showing worsening displacement now with dislocation -CT ordered for further review and pre-op planning. -The dislocated ankle was reduced and placed in a 3 sided short leg splint. -Discussed with patient proceeding with surgical intervention consisting of open reduction internal fixation versus closed reduction external fixation.  Discussed patient is high risk for the operative intervention however operative intervention is indicated due to displacement.  He has been noncompliant with  nonweightbearing precautions.  He is high risk due to end-stage renal disease on dialysis, diabetes, PAD, history of previous toe amputation.  We did discuss these risks and discussed that there is a real possibility that he could lose his leg with surgery.  We did discuss however that not proceeding with surgery would lead to a nonfunctional painful limb the patient did wish to proceed with surgical intervention.  We will proceed with the procedure however will be mindful of risk factors and and plan for external fixation if possible. Consent form reviewed with patient. Forms for surgical clearance given to patient to be filled out by his PCP.  R Greater Trochanter Fracture -Pending ortho evaluation.   No follow-ups on file.

## 2019-04-27 DIAGNOSIS — N186 End stage renal disease: Secondary | ICD-10-CM | POA: Diagnosis not present

## 2019-04-27 DIAGNOSIS — S8292XA Unspecified fracture of left lower leg, initial encounter for closed fracture: Secondary | ICD-10-CM | POA: Diagnosis not present

## 2019-04-27 DIAGNOSIS — S72114A Nondisplaced fracture of greater trochanter of right femur, initial encounter for closed fracture: Secondary | ICD-10-CM | POA: Diagnosis not present

## 2019-04-27 DIAGNOSIS — S82842A Displaced bimalleolar fracture of left lower leg, initial encounter for closed fracture: Secondary | ICD-10-CM | POA: Diagnosis not present

## 2019-04-27 DIAGNOSIS — I132 Hypertensive heart and chronic kidney disease with heart failure and with stage 5 chronic kidney disease, or end stage renal disease: Secondary | ICD-10-CM | POA: Diagnosis not present

## 2019-04-28 ENCOUNTER — Inpatient Hospital Stay
Admission: AD | Admit: 2019-04-28 | Payer: Medicare Other | Source: Other Acute Inpatient Hospital | Admitting: Family Medicine

## 2019-04-28 ENCOUNTER — Other Ambulatory Visit: Payer: Self-pay | Admitting: Podiatry

## 2019-04-28 DIAGNOSIS — S72114A Nondisplaced fracture of greater trochanter of right femur, initial encounter for closed fracture: Secondary | ICD-10-CM | POA: Diagnosis not present

## 2019-04-28 DIAGNOSIS — N186 End stage renal disease: Secondary | ICD-10-CM | POA: Diagnosis not present

## 2019-04-28 DIAGNOSIS — S82842A Displaced bimalleolar fracture of left lower leg, initial encounter for closed fracture: Secondary | ICD-10-CM | POA: Diagnosis not present

## 2019-04-28 DIAGNOSIS — I132 Hypertensive heart and chronic kidney disease with heart failure and with stage 5 chronic kidney disease, or end stage renal disease: Secondary | ICD-10-CM | POA: Diagnosis not present

## 2019-04-28 MED ORDER — OXYCODONE-ACETAMINOPHEN 5-325 MG PO TABS: 1.0000 | ORAL_TABLET | ORAL | 0 refills | Status: DC | PRN

## 2019-04-28 MED ORDER — CLINDAMYCIN HCL 300 MG PO CAPS: 300.0000 mg | ORAL_CAPSULE | Freq: Two times a day (BID) | ORAL | 0 refills | Status: DC

## 2019-04-28 NOTE — Progress Notes (Signed)
Rx sent to pharmacy   

## 2019-04-29 ENCOUNTER — Encounter: Payer: Self-pay | Admitting: Podiatry

## 2019-05-05 ENCOUNTER — Encounter: Payer: Medicare Other | Admitting: Sports Medicine

## 2019-05-05 ENCOUNTER — Encounter: Payer: Self-pay | Admitting: Sports Medicine

## 2019-05-05 ENCOUNTER — Ambulatory Visit (INDEPENDENT_AMBULATORY_CARE_PROVIDER_SITE_OTHER): Payer: Self-pay | Admitting: Sports Medicine

## 2019-05-05 ENCOUNTER — Other Ambulatory Visit: Payer: Self-pay

## 2019-05-05 ENCOUNTER — Telehealth: Payer: Self-pay | Admitting: *Deleted

## 2019-05-05 ENCOUNTER — Ambulatory Visit (INDEPENDENT_AMBULATORY_CARE_PROVIDER_SITE_OTHER): Payer: Medicare Other

## 2019-05-05 ENCOUNTER — Other Ambulatory Visit: Payer: Self-pay | Admitting: Sports Medicine

## 2019-05-05 DIAGNOSIS — N186 End stage renal disease: Secondary | ICD-10-CM

## 2019-05-05 DIAGNOSIS — Z794 Long term (current) use of insulin: Secondary | ICD-10-CM

## 2019-05-05 DIAGNOSIS — E0851 Diabetes mellitus due to underlying condition with diabetic peripheral angiopathy without gangrene: Secondary | ICD-10-CM

## 2019-05-05 DIAGNOSIS — Z992 Dependence on renal dialysis: Secondary | ICD-10-CM

## 2019-05-05 DIAGNOSIS — S82842D Displaced bimalleolar fracture of left lower leg, subsequent encounter for closed fracture with routine healing: Secondary | ICD-10-CM

## 2019-05-05 DIAGNOSIS — Z9889 Other specified postprocedural states: Secondary | ICD-10-CM

## 2019-05-05 MED ORDER — OXYCODONE-ACETAMINOPHEN 5-325 MG PO TABS: 1.0000 | ORAL_TABLET | ORAL | 0 refills | Status: DC | PRN

## 2019-05-05 MED ORDER — CLINDAMYCIN HCL 300 MG PO CAPS: 300.0000 mg | ORAL_CAPSULE | Freq: Two times a day (BID) | ORAL | 0 refills | Status: DC

## 2019-05-05 NOTE — Telephone Encounter (Signed)
[facility] - John Ortiz., PT request orders for PT for pt 1 x week for 1 week and 2 x week for 5 weeks, and pt's current weight bearing status.

## 2019-05-05 NOTE — Telephone Encounter (Signed)
Approved. WBAT for transfers only

## 2019-05-05 NOTE — Progress Notes (Signed)
Subjective: John Ortiz is a 73 y.o. male patient seen today in office for POV #1 DOS 04/27/2019, S/P oval hardware, reduction of bimalleolar fracture and application of external fixator on left foot and ankle with Dr. March Rummage. Patient is a little pain at the surgical site especially after having dialysis today and sitting with his leg in an extended position for over 4 hours, denies calf pain, denies headache, chest pain, shortness of breath, nausea, vomiting, fever, or chills. No other issues noted.   Patient Active Problem List   Diagnosis Date Noted  . Sprain of right ankle 12/21/2018  . 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 Folliculitis 44/81/8563  . Diabetic ulcer of right foot associated with diabetes mellitus due to underlying condition ([place]) 07/09/2018  . Follow up 07/09/2018  . Anemia in chronic kidney disease, on chronic dialysis ([place]) 06/04/2018  . CHF (congestive heart failure) ([place]) 06/04/2018  . Shortness of breath 06/04/2018  . Diabetes mellitus due to underlying condition with circulatory complication, with long-term current use of insulin ([place]) 06/04/2018  . Atrial fibrillation and flutter ([place]) 04/22/2018  . Coronary artery disease involving coronary bypass graft of native heart without angina pectoris 04/22/2018  . Aortic stenosis, moderate 04/22/2018  . Hx of CABG 04/22/2018  . S/P AVR (aortic valve replacement) 04/22/2018  . ESRD (end stage renal disease) on dialysis ([place]) 04/22/2018  . PAD (peripheral artery disease) ([place]) 04/14/2018  . Critical lower limb ischemia 04/14/2018  . Abscess of right groin 04/28/2015  . Coronary artery disease involving native coronary artery of native heart without angina pectoris 12/21/2014  . Chronic diastolic heart failure ([place]) 06/02/2014  . DKA (diabetic ketoacidoses) ([place]) 05/14/2014  . 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 Closed left ankle fracture 01/31/2014  . Tibial  plateau fracture, left, closed, initial encounter 01/23/2014  . Cellulitis 04/26/2013  . Cerumen impaction 09/18/2012  . Contusion 02/12/2012  . Fall 02/12/2012  . Dyslipidemia 07/29/2011  . Edema 07/29/2011  . Essential hypertension 07/29/2011    Current Outpatient Medications on File Prior to Visit  Medication Sig Dispense Refill  . acetaminophen (TYLENOL) 500 MG tablet Take 1,000 mg by mouth at bedtime as needed for moderate pain.    Mar[place] Kitchen aspirin EC 81 MG tablet Take 81 mg by mouth at bedtime.     . B Complex-C-Folic Acid (DIALYVITE 149) 0.8 MG TABS Take 1 tablet by mouth daily with lunch.     . calcium acetate (PHOSLO) 667 MG capsule Take 2,001 mg by mouth 3 (three) times daily with meals.    Marland Kitchen FLUoxetine (PROZAC) 20 MG capsule Take 20 mg by mouth daily.    . furosemide (LASIX) 80 MG tablet Take 80 mg by mouth 2 (two) times daily.    Marland Kitchen glucosamine-chondroitin 500-400 MG tablet Take by mouth.    Marland Kitchen glucose 4 GM chewable tablet Chew 1-5 tablets by mouth as needed for low blood sugar.    Marland Kitchen glucose blood (PRECISION XTRA TEST STRIPS) test strip Use one strip to test blood sugar as directed    . insulin aspart (NOVOLOG FLEXPEN) 100 UNIT/ML FlexPen Inject 7-15 Units into the skin 3 (three) times daily with meals. Sliding Scale Insulin    . insulin glargine (LANTUS) 100 UNIT/ML injection Inject 8 Units into the skin 2 (two) times daily.     Marland Kitchen oxyCODONE (ROXICODONE) 5 MG immediate release tablet Take 1 tablet (5 mg total) by mouth every 6 (six) hours as  needed. 12 tablet 0  . silver sulfADIAZINE (SILVADENE) 1 % cream Apply pea-sized amount to wound daily. (Patient taking differently: Apply 1 application topically daily. Apply pea-sized amount to wound daily.) 50 g 0  . simvastatin (ZOCOR) 40 MG tablet Take 40 mg by mouth at bedtime.    . TechLite Lancets MISC 1 each by Misc.(Non-Drug; Combo Route) route daily.    . traMADol (ULTRAM) 50 MG tablet Take by mouth.    . vitamin B-12 (CYANOCOBALAMIN)  500 MCG tablet Take 500 mcg by mouth daily.     No current facility-administered medications on file prior to visit.     Allergies  Allergen Reactions  . Promethazine Other (See Comments)    Tremors, cold, restlessness.  . Povidone-Iodine Rash    Objective: There were no vitals filed for this visit.  General: No acute distress, AAOx3  Foot and ankle: External fixator intact upon removal of dressings there is mild bleeding noted to the medial aspect of the left ankle pin sites are clean with mild bloody drainage which was cleaned and removed with alcohol to the pin site and the area was redressed with a combination of Adaptic and Xeroform and dry dressing.  Capillary fill time intact all remaining digits of the left foot there is mild pain with manipulation and extended periods of elevation to the left foot and ankle.  No significant redness warmth swelling or any signs of infection noted to the left foot and ankle.  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, with long-term current use of insulin (HCC)     Genitourinary   ESRD (end stage renal disease) on dialysis [facility])    Other Visit Diagnoses    Closed displaced bimalleolar fracture of left lower leg with routine healing    -  Primary   Relevant Medications   oxyCODONE-acetaminophen (PERCOCET) 5-325 MG tablet   clindamycin (CLEOCIN) 300 MG capsule   Post-operative state           -Patient seen and evaluated -Xray obtained to the best of ability with external fixator intact on left ankle -Applied adaptic and dry sterile dressing to surgical site left foot and ankle secured with ACE wrap -Keep dressing clean dry and intact will make sure nursing orders are in place to provide pin care and to rewrap the external fixator weekly -Refilled Percocet to take as needed for pain and refill clindamycin since patient is unsure  of how much antibiotics she has left -Advised patient to rest and elevation and NWB on the fixator -Will plan for follow up with Dr. March Rummage for continued care at next office visit. In the meantime, patient to call office if any issues or problems arise.   Landis Martins, DPM

## 2019-05-06 ENCOUNTER — Telehealth: Payer: Self-pay | Admitting: *Deleted

## 2019-05-06 NOTE — Telephone Encounter (Signed)
-----   Message from Landis Martins, [place] sent at 05/05/2019 10:00 PM EDT ----- Regarding: Home nursing Dr. March Rummage patient This is a Dr. March Rummage patient but please make sure he has home nursing arranged for them to do pin care at his external fixator device on his left foot and ankle weekly if there are any questions about specific orders please contact Dr. March Rummage regarding this patient thanks Dr. Cannon Kettle

## 2019-05-06 NOTE — Telephone Encounter (Signed)
Left message informing Jeanie Cooks., PT - [facility] of Dr. Eleanora Neighbor 05/05/2019 6:56pm orders.

## 2019-05-11 ENCOUNTER — Ambulatory Visit (INDEPENDENT_AMBULATORY_CARE_PROVIDER_SITE_OTHER): Payer: Self-pay | Admitting: Podiatry

## 2019-05-11 ENCOUNTER — Other Ambulatory Visit: Payer: Self-pay

## 2019-05-11 DIAGNOSIS — Z9889 Other specified postprocedural states: Secondary | ICD-10-CM

## 2019-05-12 NOTE — Telephone Encounter (Signed)
No pinsite care will do it in office

## 2019-05-13 ENCOUNTER — Telehealth: Payer: Self-pay | Admitting: *Deleted

## 2019-05-13 NOTE — Telephone Encounter (Signed)
I informed [facility] - [facility] of Dr. Eleanora Neighbor okay for the recommended teaching and monitoring of pt.

## 2019-05-13 NOTE — Telephone Encounter (Signed)
[facility] request verbal orders for 2 x week for 3 weeks for diabetic teaching and to monitor foot wound. Dr. Randie Heinz.

## 2019-05-18 ENCOUNTER — Other Ambulatory Visit: Payer: Self-pay

## 2019-05-18 ENCOUNTER — Ambulatory Visit (INDEPENDENT_AMBULATORY_CARE_PROVIDER_SITE_OTHER): Payer: Self-pay | Admitting: Podiatry

## 2019-05-18 DIAGNOSIS — Z9889 Other specified postprocedural states: Secondary | ICD-10-CM

## 2019-05-18 NOTE — Progress Notes (Signed)
Subjective:  Patient ID: John Ortiz, male    DOB: June 30, 1946,  MRN: [id number]  Chief Complaint  Patient presents with  . Routine Post Op     POV#2 DOS 04/27/2019 ANKLE ORIF LT.    DOS: 04/27/2019 Procedure: Left Ankle ORIF, application of External fixator  73 y.o. male returns for post-op check.  Has been doing well states that his pain is been much better since he has had the frame on.  Denies post-op issues or concerns.  Review of Systems: Negative except as noted in the HPI. Denies N/V/F/Ch.  Past Medical History:  Diagnosis Date  . Anemia in CKD (chronic kidney disease)   . Aneurysm (HCC)    Left arm  . Arthritis   . CHF (congestive heart failure) ([place])   . Chronic kidney disease, stage V ([place])    MWF- [place]  . Coagulation defect ([place])   . Coronary artery disease   . Diabetes mellitus type 2, uncomplicated ([place])   . Diarrhea   . Fluid overload   . GERD (gastroesophageal reflux disease)   . Heart murmur   . Hypercalcemia   . Iron deficiency anemia, unspecified   . Non-healing ulcer of foot ([place])    Right foot  . Pain   . Pruritus   . Renal osteodystrophy   . Secondary hyperparathyroidism of renal origin ([place])   . Shortness of breath   . Type 2 diabetes mellitus with hypoglycemia without coma ([place])   . Unspecified protein-calorie malnutrition ([place])     Current Outpatient Medications:  .  acetaminophen (TYLENOL) 500 MG tablet, Take 1,000 mg by mouth at bedtime as needed for moderate pain., Disp: , Rfl:  .  aspirin EC 81 MG tablet, Take 81 mg by mouth at bedtime. , Disp: , Rfl:  .  B Complex-C-Folic Acid (DIALYVITE 680) 0.8 MG TABS, Take 1 tablet by mouth daily with lunch. , Disp: , Rfl:  .  calcium acetate (PHOSLO) 667 MG capsule, Take 2,001 mg by mouth 3 (three) times daily with meals., Disp: , Rfl:  .  clindamycin (CLEOCIN) 300 MG capsule, Take 1 capsule (300 mg total) by mouth 2 (two) times daily., Disp: 14 capsule, Rfl: 0 .  clopidogrel (PLAVIX) 75  MG tablet, Take by mouth., Disp: , Rfl:  .  FLUoxetine (PROZAC) 20 MG capsule, Take 20 mg by mouth daily., Disp: , Rfl:  .  furosemide (LASIX) 80 MG tablet, Take 80 mg by mouth 2 (two) times daily., Disp: , Rfl:  .  glucosamine-chondroitin 500-400 MG tablet, Take by mouth., Disp: , Rfl:  .  glucose 4 GM chewable tablet, Chew 1-5 tablets by mouth as needed for low blood sugar., Disp: , Rfl:  .  glucose blood (PRECISION XTRA TEST STRIPS) test strip, Use one strip to test blood sugar as directed, Disp: , Rfl:  .  insulin aspart (NOVOLOG FLEXPEN) 100 UNIT/ML FlexPen, Inject 7-15 Units into the skin 3 (three) times daily with meals. Sliding Scale Insulin, Disp: , Rfl:  .  insulin glargine (LANTUS) 100 UNIT/ML injection, Inject 8 Units into the skin 2 (two) times daily. , Disp: , Rfl:  .  Lidocaine HCl 4 % CREA, Apply topically., Disp: , Rfl:  .  nitroGLYCERIN (NITROSTAT) 0.4 MG SL tablet, Place under the tongue., Disp: , Rfl:  .  oxyCODONE (ROXICODONE) 5 MG immediate release tablet, Take 1 tablet (5 mg total) by mouth every 6 (six) hours as needed., Disp: 12 tablet, Rfl: 0 .  oxyCODONE-acetaminophen (  PERCOCET) 5-325 MG tablet, Take 1 tablet by mouth every 4 (four) hours as needed for severe pain., Disp: 20 tablet, Rfl: 0 .  silver sulfADIAZINE (SILVADENE) 1 % cream, Apply pea-sized amount to wound daily. (Patient taking differently: Apply 1 application topically daily. Apply pea-sized amount to wound daily.), Disp: 50 g, Rfl: 0 .  simvastatin (ZOCOR) 40 MG tablet, Take 40 mg by mouth at bedtime., Disp: , Rfl:  .  Skin Protectants, Misc. (EUCERIN) cream, Apply topically., Disp: , Rfl:  .  TechLite Lancets MISC, 1 each by Misc.(Non-Drug; Combo Route) route daily., Disp: , Rfl:  .  traMADol (ULTRAM) 50 MG tablet, Take by mouth., Disp: , Rfl:  .  vitamin B-12 (CYANOCOBALAMIN) 500 MCG tablet, Take 500 mcg by mouth daily., Disp: , Rfl:   Social History   Tobacco Use  Smoking Status Former Smoker  .  Years: 15.00  . Quit date: 01/24/1989  . Years since quitting: 30.3  Smokeless Tobacco Never Used    Allergies  Allergen Reactions  . Promethazine Other (See Comments)    Tremors, cold, restlessness.  . Povidone-Iodine Rash   Objective:  There were no vitals filed for this visit. There is no height or weight on file to calculate BMI. Constitutional Well developed. Well nourished.  Vascular Foot warm and well perfused. Capillary refill normal to all digits.   Neurologic Normal speech. Oriented to person, place, and time. Epicritic sensation to light touch grossly present bilaterally.  Dermatologic Pin sites healing well small areas of crusting no areas of discharge warmth erythema signs of acute infection  Orthopedic: No tenderness palpation about the surgical sites.  Ankle position appears anatomic   Radiographs: None Assessment:  No diagnosis found. Plan:  Patient was evaluated and treated and all questions answered.  S/p foot surgery left -Progressing as expected post-operatively. -XR: None -WB Status: minimal weightbearing (for transfers only) -Sutures: intact. -Medications: none refilled. -Foot redressed.  Xeroform 4 x 4 Kerlix applied around the pin sites and the entire frame was wrapped with Ace bandage.  No follow-ups on file.

## 2019-05-18 NOTE — Progress Notes (Signed)
Subjective:  Patient ID: John Ortiz, male    DOB: March 31, 1946,  MRN: [id number]  Chief Complaint  Patient presents with  . Routine Post Op    Pt. states," pain down at the top of my foot and down below my foot, but it's not as bad as the other pains I have; 5/10." TxL noen    DOS: 04/27/2019 Procedure: Left Ankle ORIF, application of External fixator  73 y.o. male returns for post-op check.  History above confirmed with patient.  Unable to get medications due to [place] issues  Review of Systems: Negative except as noted in the HPI. Denies N/V/F/Ch.  Past Medical History:  Diagnosis Date  . Anemia in CKD (chronic kidney disease)   . Aneurysm (HCC)    Left arm  . Arthritis   . CHF (congestive heart failure) ([place])   . Chronic kidney disease, stage V ([place])    MWF- [facility]  . Coagulation defect ([place])   . Coronary artery disease   . Diabetes mellitus type 2, uncomplicated ([place])   . Diarrhea   . Fluid overload   . GERD (gastroesophageal reflux disease)   . Heart murmur   . Hypercalcemia   . Iron deficiency anemia, unspecified   . Non-healing ulcer of foot ([place])    Right foot  . Pain   . Pruritus   . Renal osteodystrophy   . Secondary hyperparathyroidism of renal origin ([place])   . Shortness of breath   . Type 2 diabetes mellitus with hypoglycemia without coma ([place])   . Unspecified protein-calorie malnutrition ([place])     Current Outpatient Medications:  .  acetaminophen (TYLENOL) 500 MG tablet, Take 1,000 mg by mouth at bedtime as needed for moderate pain., Disp: , Rfl:  .  aspirin EC 81 MG tablet, Take 81 mg by mouth at bedtime. , Disp: , Rfl:  .  B Complex-C-Folic Acid (DIALYVITE 124) 0.8 MG TABS, Take 1 tablet by mouth daily with lunch. , Disp: , Rfl:  .  calcium acetate (PHOSLO) 667 MG capsule, Take 2,001 mg by mouth 3 (three) times daily with meals., Disp: , Rfl:  .  clindamycin (CLEOCIN) 300 MG capsule, Take 1 capsule (300 mg total) by mouth 2 (two) times daily.,  Disp: 14 capsule, Rfl: 0 .  clopidogrel (PLAVIX) 75 MG tablet, Take by mouth., Disp: , Rfl:  .  FLUoxetine (PROZAC) 20 MG capsule, Take 20 mg by mouth daily., Disp: , Rfl:  .  furosemide (LASIX) 80 MG tablet, Take 80 mg by mouth 2 (two) times daily., Disp: , Rfl:  .  glucosamine-chondroitin 500-400 MG tablet, Take by mouth., Disp: , Rfl:  .  glucose 4 GM chewable tablet, Chew 1-5 tablets by mouth as needed for low blood sugar., Disp: , Rfl:  .  glucose blood (PRECISION XTRA TEST STRIPS) test strip, Use one strip to test blood sugar as directed, Disp: , Rfl:  .  insulin aspart (NOVOLOG FLEXPEN) 100 UNIT/ML FlexPen, Inject 7-15 Units into the skin 3 (three) times daily with meals. Sliding Scale Insulin, Disp: , Rfl:  .  insulin glargine (LANTUS) 100 UNIT/ML injection, Inject 8 Units into the skin 2 (two) times daily. , Disp: , Rfl:  .  Lidocaine HCl 4 % CREA, Apply topically., Disp: , Rfl:  .  nitroGLYCERIN (NITROSTAT) 0.4 MG SL tablet, Place under the tongue., Disp: , Rfl:  .  oxyCODONE (ROXICODONE) 5 MG immediate release tablet, Take 1 tablet (5 mg total) by mouth every 6 (six)  hours as needed., Disp: 12 tablet, Rfl: 0 .  oxyCODONE-acetaminophen (PERCOCET) 5-325 MG tablet, Take 1 tablet by mouth every 4 (four) hours as needed for severe pain., Disp: 20 tablet, Rfl: 0 .  silver sulfADIAZINE (SILVADENE) 1 % cream, Apply pea-sized amount to wound daily. (Patient taking differently: Apply 1 application topically daily. Apply pea-sized amount to wound daily.), Disp: 50 g, Rfl: 0 .  simvastatin (ZOCOR) 40 MG tablet, Take 40 mg by mouth at bedtime., Disp: , Rfl:  .  Skin Protectants, Misc. (EUCERIN) cream, Apply topically., Disp: , Rfl:  .  TechLite Lancets MISC, 1 each by Misc.(Non-Drug; Combo Route) route daily., Disp: , Rfl:  .  traMADol (ULTRAM) 50 MG tablet, Take by mouth., Disp: , Rfl:  .  vitamin B-12 (CYANOCOBALAMIN) 500 MCG tablet, Take 500 mcg by mouth daily., Disp: , Rfl:   Social History    Tobacco Use  Smoking Status Former Smoker  . Years: 15.00  . Quit date: 01/24/1989  . Years since quitting: 30.3  Smokeless Tobacco Never Used    Allergies  Allergen Reactions  . Promethazine Other (See Comments)    Tremors, cold, restlessness.  . Povidone-Iodine Rash   Objective:  There were no vitals filed for this visit. There is no height or weight on file to calculate BMI. Constitutional Well developed. Well nourished.  Vascular Foot warm and well perfused. Capillary refill normal to all digits.   Neurologic Normal speech. Oriented to person, place, and time. Epicritic sensation to light touch grossly present bilaterally.  Dermatologic  incision with overlying eschar no signs of infection.  Pin site healing well without signs of infection no warmth no erythema no purulence  Orthopedic: No tenderness palpation about the surgical sites.  Ankle position appears anatomic   Radiographs: None Assessment:   1. Post-operative state    Plan:  Patient was evaluated and treated and all questions answered.  S/p foot surgery left -Progressing as expected post-operatively. -XR: None -WB Status: minimal weightbearing (for transfers only) -Sutures: intact. -Medications: none refilled. -Foot redressed.  Adaptic and 4 x 4 applied around each pin site and incision.  Kerlix and Ace bandage applied  No follow-ups on file.

## 2019-05-23 NOTE — Progress Notes (Signed)
Subjective:  Patient ID: John Ortiz, male    DOB: Aug 18, 1946,  MRN: [id number]  No chief complaint on file.  73 y.o. male presents with the above complaint.  Has been walking on the cast despite nonweightbearing restrictions.  Review of Systems: Negative except as noted in the HPI. Denies N/V/F/Ch.  Past Medical History:  Diagnosis Date  . Anemia in CKD (chronic kidney disease)   . Aneurysm (HCC)    Left arm  . Arthritis   . CHF (congestive heart failure) ([place])   . Chronic kidney disease, stage V ([place])    MWF- [facility]  . Coagulation defect ([place])   . Coronary artery disease   . Diabetes mellitus type 2, uncomplicated ([place])   . Diarrhea   . Fluid overload   . GERD (gastroesophageal reflux disease)   . Heart murmur   . Hypercalcemia   . Iron deficiency anemia, unspecified   . Non-healing ulcer of foot ([place])    Right foot  . Pain   . Pruritus   . Renal osteodystrophy   . Secondary hyperparathyroidism of renal origin ([place])   . Shortness of breath   . Type 2 diabetes mellitus with hypoglycemia without coma ([place])   . Unspecified protein-calorie malnutrition ([place])     Current Outpatient Medications:  .  acetaminophen (TYLENOL) 500 MG tablet, Take 1,000 mg by mouth at bedtime as needed for moderate pain., Disp: , Rfl:  .  aspirin EC 81 MG tablet, Take 81 mg by mouth at bedtime. , Disp: , Rfl:  .  B Complex-C-Folic Acid (DIALYVITE 622) 0.8 MG TABS, Take 1 tablet by mouth daily with lunch. , Disp: , Rfl:  .  calcium acetate (PHOSLO) 667 MG capsule, Take 2,001 mg by mouth 3 (three) times daily with meals., Disp: , Rfl:  .  clindamycin (CLEOCIN) 300 MG capsule, Take 1 capsule (300 mg total) by mouth 2 (two) times daily., Disp: 14 capsule, Rfl: 0 .  clopidogrel (PLAVIX) 75 MG tablet, Take by mouth., Disp: , Rfl:  .  FLUoxetine (PROZAC) 20 MG capsule, Take 20 mg by mouth daily., Disp: , Rfl:  .  furosemide (LASIX) 80 MG tablet, Take 80 mg by mouth 2 (two) times daily., Disp:  , Rfl:  .  glucosamine-chondroitin 500-400 MG tablet, Take by mouth., Disp: , Rfl:  .  glucose 4 GM chewable tablet, Chew 1-5 tablets by mouth as needed for low blood sugar., Disp: , Rfl:  .  glucose blood (PRECISION XTRA TEST STRIPS) test strip, Use one strip to test blood sugar as directed, Disp: , Rfl:  .  insulin aspart (NOVOLOG FLEXPEN) 100 UNIT/ML FlexPen, Inject 7-15 Units into the skin 3 (three) times daily with meals. Sliding Scale Insulin, Disp: , Rfl:  .  insulin glargine (LANTUS) 100 UNIT/ML injection, Inject 8 Units into the skin 2 (two) times daily. , Disp: , Rfl:  .  Lidocaine HCl 4 % CREA, Apply topically., Disp: , Rfl:  .  nitroGLYCERIN (NITROSTAT) 0.4 MG SL tablet, Place under the tongue., Disp: , Rfl:  .  oxyCODONE (ROXICODONE) 5 MG immediate release tablet, Take 1 tablet (5 mg total) by mouth every 6 (six) hours as needed., Disp: 12 tablet, Rfl: 0 .  oxyCODONE-acetaminophen (PERCOCET) 5-325 MG tablet, Take 1 tablet by mouth every 4 (four) hours as needed for severe pain., Disp: 20 tablet, Rfl: 0 .  silver sulfADIAZINE (SILVADENE) 1 % cream, Apply pea-sized amount to wound daily. (Patient taking differently: Apply 1 application topically  daily. Apply pea-sized amount to wound daily.), Disp: 50 g, Rfl: 0 .  simvastatin (ZOCOR) 40 MG tablet, Take 40 mg by mouth at bedtime., Disp: , Rfl:  .  Skin Protectants, Misc. (EUCERIN) cream, Apply topically., Disp: , Rfl:  .  TechLite Lancets MISC, 1 each by Misc.(Non-Drug; Combo Route) route daily., Disp: , Rfl:  .  traMADol (ULTRAM) 50 MG tablet, Take by mouth., Disp: , Rfl:  .  vitamin B-12 (CYANOCOBALAMIN) 500 MCG tablet, Take 500 mcg by mouth daily., Disp: , Rfl:   Social History   Tobacco Use  Smoking Status Former Smoker  . Years: 15.00  . Quit date: 01/24/1989  . Years since quitting: 30.3  Smokeless Tobacco Never Used    Allergies  Allergen Reactions  . Promethazine Other (See Comments)    Tremors, cold, restlessness.  .  Povidone-Iodine Rash   Objective:   There were no vitals filed for this visit. There is no height or weight on file to calculate BMI. Constitutional Well developed. Well nourished.  Vascular Dorsalis pedis pulses barely palpable bilaterally. Posterior tibial pulses non palpable bilaterally. Capillary refill normal to all digits.  No cyanosis or clubbing noted. Pedal hair growth normal.  Neurologic Normal speech. Oriented to person, place, and time. Epicritic sensation to light touch grossly present bilaterally.  Dermatologic Nails normal Skin left plantar foot large blister without warmth erythema signs of infection, serous drainage under blister with redudant skin Left lateral fifth MPJ wound without warmth erythema signs of acute infection  Orthopedic:  Swelling and pitting edema to the left foot pain to palpation about the left ankle both medially and laterally at both malleoli.   Radiographs: Fracture again visualized with worsening displacement Assessment:   1. Bimalleolar fracture of left ankle, closed, initial encounter   2. Skin ulcer of right foot with fat layer exposed ([place])    Plan:  Patient was evaluated and treated and all questions answered.  Bimalleolar fracture left foot -Patient was walking on his cast and now has a wound is due to the cast.  Soft cast applied today we will plan to resume hard cast once wound is healed.

## 2019-05-24 DIAGNOSIS — E782 Mixed hyperlipidemia: Secondary | ICD-10-CM | POA: Insufficient documentation

## 2019-05-24 DIAGNOSIS — Z7982 Long term (current) use of aspirin: Secondary | ICD-10-CM | POA: Insufficient documentation

## 2019-05-25 ENCOUNTER — Encounter: Payer: Medicare Other | Admitting: Podiatry

## 2019-05-25 ENCOUNTER — Other Ambulatory Visit: Payer: Self-pay

## 2019-05-25 DIAGNOSIS — Z87891 Personal history of nicotine dependence: Secondary | ICD-10-CM | POA: Insufficient documentation

## 2019-05-26 ENCOUNTER — Other Ambulatory Visit: Payer: Self-pay | Admitting: Sports Medicine

## 2019-05-26 DIAGNOSIS — S82842D Displaced bimalleolar fracture of left lower leg, subsequent encounter for closed fracture with routine healing: Secondary | ICD-10-CM

## 2019-05-26 DIAGNOSIS — Z9889 Other specified postprocedural states: Secondary | ICD-10-CM

## 2019-05-27 ENCOUNTER — Telehealth: Payer: Self-pay | Admitting: *Deleted

## 2019-05-27 NOTE — Telephone Encounter (Signed)
[facility] - [facility] request to move pt's discharge date out one week.

## 2019-05-27 NOTE — Telephone Encounter (Signed)
I told Brittney - [facility] that would be okay to extend one week.

## 2019-06-01 ENCOUNTER — Ambulatory Visit (INDEPENDENT_AMBULATORY_CARE_PROVIDER_SITE_OTHER): Payer: Medicare Other

## 2019-06-01 ENCOUNTER — Other Ambulatory Visit: Payer: Self-pay

## 2019-06-01 ENCOUNTER — Ambulatory Visit (INDEPENDENT_AMBULATORY_CARE_PROVIDER_SITE_OTHER): Payer: Medicare Other | Admitting: Podiatry

## 2019-06-01 ENCOUNTER — Other Ambulatory Visit: Payer: Self-pay | Admitting: Podiatry

## 2019-06-01 DIAGNOSIS — M79672 Pain in left foot: Secondary | ICD-10-CM

## 2019-06-01 DIAGNOSIS — S82842D Displaced bimalleolar fracture of left lower leg, subsequent encounter for closed fracture with routine healing: Secondary | ICD-10-CM

## 2019-06-01 DIAGNOSIS — I10 Essential (primary) hypertension: Secondary | ICD-10-CM

## 2019-06-02 ENCOUNTER — Other Ambulatory Visit: Payer: Self-pay | Admitting: Podiatry

## 2019-06-02 DIAGNOSIS — S82842D Displaced bimalleolar fracture of left lower leg, subsequent encounter for closed fracture with routine healing: Secondary | ICD-10-CM

## 2019-06-08 ENCOUNTER — Ambulatory Visit (INDEPENDENT_AMBULATORY_CARE_PROVIDER_SITE_OTHER): Payer: Medicare Other | Admitting: Podiatry

## 2019-06-08 ENCOUNTER — Other Ambulatory Visit: Payer: Self-pay

## 2019-06-08 DIAGNOSIS — Z9889 Other specified postprocedural states: Secondary | ICD-10-CM

## 2019-06-17 ENCOUNTER — Other Ambulatory Visit: Payer: Self-pay

## 2019-06-17 ENCOUNTER — Encounter: Payer: Self-pay | Admitting: Sports Medicine

## 2019-06-17 ENCOUNTER — Ambulatory Visit (INDEPENDENT_AMBULATORY_CARE_PROVIDER_SITE_OTHER): Payer: Medicare Other | Admitting: Sports Medicine

## 2019-06-17 DIAGNOSIS — Z794 Long term (current) use of insulin: Secondary | ICD-10-CM

## 2019-06-17 DIAGNOSIS — E0851 Diabetes mellitus due to underlying condition with diabetic peripheral angiopathy without gangrene: Secondary | ICD-10-CM

## 2019-06-17 DIAGNOSIS — N186 End stage renal disease: Secondary | ICD-10-CM

## 2019-06-17 DIAGNOSIS — Z9889 Other specified postprocedural states: Secondary | ICD-10-CM

## 2019-06-17 DIAGNOSIS — Z992 Dependence on renal dialysis: Secondary | ICD-10-CM

## 2019-06-17 DIAGNOSIS — S82842D Displaced bimalleolar fracture of left lower leg, subsequent encounter for closed fracture with routine healing: Secondary | ICD-10-CM

## 2019-06-17 NOTE — Progress Notes (Signed)
Subjective: John Ortiz is a 73 y.o. male patient seen today in office for POV, DOS 04/27/2019, S/P removal of hardware, reduction of bimalleolar fracture and application of external fixator on left foot and ankle with Dr. March Rummage. Patient is a little pain at the surgical site and admits that home nursing ended on last week.  Denies calf pain, denies headache, chest pain, shortness of breath, nausea, vomiting, fever, or chills. No other issues noted.   Patient Active Problem List   Diagnosis Date Noted  . Former smoker 05/25/2019  . Long-term use of aspirin therapy 05/24/2019  . Mixed hyperlipidemia 05/24/2019  . Sprain of right ankle 12/21/2018  . 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 Folliculitis 24/23/5361  . Diabetic ulcer of right foot associated with diabetes mellitus due to underlying condition ([place]) 07/09/2018  . Follow up 07/09/2018  . Anemia in chronic kidney disease, on chronic dialysis ([place]) 06/04/2018  . CHF (congestive heart failure) ([place]) 06/04/2018  . Shortness of breath 06/04/2018  . 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 Atrial fibrillation and flutter ([place]) 04/22/2018  . Coronary artery disease involving coronary bypass graft of native heart without angina pectoris 04/22/2018  . Aortic stenosis, moderate 04/22/2018  . Hx of CABG 04/22/2018  . S/P AVR (aortic valve replacement) 04/22/2018  . ESRD (end stage renal disease) on dialysis ([place]) 04/22/2018  . PAD (peripheral artery disease) ([place]) 04/14/2018  . Critical lower limb ischemia 04/14/2018  . Hypercalcemia 10/24/2017  . Fluid overload, unspecified 10/22/2017  . Coagulation defect, unspecified ([place]) 08/01/2017  . Diarrhea, unspecified 08/01/2017  . Pain, unspecified 08/01/2017  . Pruritus, unspecified 08/01/2017  . Dependence on renal dialysis ([place])  07/30/2017  . Iron deficiency anemia, unspecified 07/30/2017  . Long term (current) use of insulin ([place]) 07/30/2017  . Renal osteodystrophy 07/30/2017  . Secondary hyperparathyroidism of renal origin ([place]) 07/30/2017  . Unspecified protein-calorie malnutrition ([place]) 07/30/2017  . Abscess of right groin 04/28/2015  . Coronary artery disease involving native coronary artery of native heart without angina pectoris 12/21/2014  . Chronic diastolic heart failure ([place]) 06/02/2014  . DKA (diabetic ketoacidoses) ([place]) 05/14/2014  . 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 Closed left ankle fracture 01/31/2014  . Tibial plateau fracture, left, closed, initial encounter 01/23/2014  . Cellulitis 04/26/2013  . Cerumen impaction 09/18/2012  . Contusion 02/12/2012  . Fall 02/12/2012  . Dyslipidemia 07/29/2011  . Edema 07/29/2011  . Essential hypertension 07/29/2011    Current Outpatient Medications on File Prior to Visit  Medication Sig Dispense Refill  . acetaminophen (TYLENOL) 500 MG tablet Take 1,000 mg by mouth at bedtime as needed for moderate pain.    [place] Kitchen aspirin EC 81 MG tablet Take 81 mg by mouth at bedtime.     . B Complex-C-Folic Acid (DIALYVITE 443) 0.8 MG TABS Take 1 tablet by mouth daily with lunch.     . calcium acetate (PHOSLO) 667 MG capsule Take 2,001 mg by mouth 3 (three) times daily with meals.    . clindamycin (CLEOCIN) 300 MG capsule Take 1 capsule (300 mg total) by mouth 2 (two) times daily. 14 capsule 0  . clopidogrel (PLAVIX) 75 MG tablet Take by mouth.    Marland Kitchen FLUoxetine (PROZAC) 20 MG capsule Take 20 mg by mouth daily.    . furosemide (LASIX) 80 MG tablet Take 80 mg by mouth 2 (two) times daily.    Marland Kitchen  glucosamine-chondroitin 500-400 MG tablet Take by mouth.    Marland Kitchen glucose 4 GM chewable tablet Chew 1-5 tablets by mouth as needed for low blood sugar.    Marland Kitchen glucose blood (PRECISION XTRA TEST STRIPS) test strip Use one strip to test blood sugar as directed    . insulin aspart (NOVOLOG  FLEXPEN) 100 UNIT/ML FlexPen Inject 7-15 Units into the skin 3 (three) times daily with meals. Sliding Scale Insulin    . insulin glargine (LANTUS) 100 UNIT/ML injection Inject 8 Units into the skin 2 (two) times daily.     . Lidocaine HCl 4 % CREA Apply topically.    . nitroGLYCERIN (NITROSTAT) 0.4 MG SL tablet Place under the tongue.    Marland Kitchen oxyCODONE (ROXICODONE) 5 MG immediate release tablet Take 1 tablet (5 mg total) by mouth every 6 (six) hours as needed. 12 tablet 0  . oxyCODONE-acetaminophen (PERCOCET) 5-325 MG tablet Take 1 tablet by mouth every 4 (four) hours as needed for severe pain. 20 tablet 0  . silver sulfADIAZINE (SILVADENE) 1 % cream Apply pea-sized amount to wound daily. (Patient taking differently: Apply 1 application topically daily. Apply pea-sized amount to wound daily.) 50 g 0  . simvastatin (ZOCOR) 40 MG tablet Take 40 mg by mouth at bedtime.    . Skin Protectants, Misc. (EUCERIN) cream Apply topically.    . TechLite Lancets MISC 1 each by Misc.(Non-Drug; Combo Route) route daily.    . traMADol (ULTRAM) 50 MG tablet Take by mouth.    . vitamin B-12 (CYANOCOBALAMIN) 500 MCG tablet Take 500 mcg by mouth daily.     No current facility-administered medications on file prior to visit.     Allergies  Allergen Reactions  . Promethazine Other (See Comments)    Tremors, cold, restlessness.  . Povidone-Iodine Rash    Objective: There were no vitals filed for this visit.  General: No acute distress, AAOx3  Foot and ankle: External fixator intact upon removal of dressings there is no bleeding noted to the medial aspect of the left ankle pin sites are clean and dry and staples intact at medial ankle, capillary fill time intact all remaining digits of the left foot there is mild pain with manipulation and extended periods of elevation to the left foot and ankle.  No significant redness warmth swelling or any signs of infection noted to the left foot and ankle.  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, with long-term current use of insulin (HCC)     Genitourinary   ESRD (end stage renal disease) on dialysis ([place])    Other Visit Diagnoses    Post-operative state    -  Primary   Closed displaced bimalleolar fracture of left lower leg with routine healing           -Patient seen and evaluated -Wound and external fixator check performed -Applied xeroform and dry sterile dressing to surgical site left foot and ankle secured with ACE wrap -Keep dressing clean dry and intact  -Advised patient to rest and elevation and may minimally weight-bear for transfers only -Will plan for follow up with Dr. March Rummage for continued care at next office visit. In the meantime, patient to call office if any issues or problems arise.   Landis Martins, DPM

## 2019-06-29 ENCOUNTER — Other Ambulatory Visit: Payer: Self-pay

## 2019-06-29 ENCOUNTER — Ambulatory Visit (INDEPENDENT_AMBULATORY_CARE_PROVIDER_SITE_OTHER): Payer: Medicare Other | Admitting: Podiatry

## 2019-06-29 DIAGNOSIS — Z9889 Other specified postprocedural states: Secondary | ICD-10-CM

## 2019-06-29 NOTE — Progress Notes (Signed)
Subjective:  Patient ID: John Ortiz, male    DOB: 1946-02-19,  MRN: [id number]  No chief complaint on file.  DOS: 04/27/2019 Procedure: Left Ankle ORIF, application of External fixator  73 y.o. male returns for post-op check.  History above confirmed with patient.  States that he is anxious to get the fixator off. Denies concerns with it. Not having pain.  Review of Systems: Negative except as noted in the HPI. Denies N/V/F/Ch.  Past Medical History:  Diagnosis Date  . Anemia in CKD (chronic kidney disease)   . Aneurysm (HCC)    Left arm  . Arthritis   . CHF (congestive heart failure) ([place])   . Chronic kidney disease, stage V ([place])    MWF- [place]  . Coagulation defect ([place])   . Coronary artery disease   . Diabetes mellitus type 2, uncomplicated ([place])   . Diarrhea   . Fluid overload   . GERD (gastroesophageal reflux disease)   . Heart murmur   . Hypercalcemia   . Iron deficiency anemia, unspecified   . Non-healing ulcer of foot ([place])    Right foot  . Pain   . Pruritus   . Renal osteodystrophy   . Secondary hyperparathyroidism of renal origin ([place])   . Shortness of breath   . Type 2 diabetes mellitus with hypoglycemia without coma ([place])   . Unspecified protein-calorie malnutrition ([place])     Current Outpatient Medications:  .  acetaminophen (TYLENOL) 500 MG tablet, Take 1,000 mg by mouth at bedtime as needed for moderate pain., Disp: , Rfl:  .  aspirin EC 81 MG tablet, Take 81 mg by mouth at bedtime. , Disp: , Rfl:  .  B Complex-C-Folic Acid (DIALYVITE 622) 0.8 MG TABS, Take 1 tablet by mouth daily with lunch. , Disp: , Rfl:  .  calcium acetate (PHOSLO) 667 MG capsule, Take 2,001 mg by mouth 3 (three) times daily with meals., Disp: , Rfl:  .  clindamycin (CLEOCIN) 300 MG capsule, Take 1 capsule (300 mg total) by mouth 2 (two) times daily., Disp: 14 capsule, Rfl: 0 .  clopidogrel (PLAVIX) 75 MG tablet, Take by mouth., Disp: , Rfl:  .  FLUoxetine (PROZAC) 20 MG  capsule, Take 20 mg by mouth daily., Disp: , Rfl:  .  furosemide (LASIX) 80 MG tablet, Take 80 mg by mouth 2 (two) times daily., Disp: , Rfl:  .  glucosamine-chondroitin 500-400 MG tablet, Take by mouth., Disp: , Rfl:  .  glucose 4 GM chewable tablet, Chew 1-5 tablets by mouth as needed for low blood sugar., Disp: , Rfl:  .  glucose blood (PRECISION XTRA TEST STRIPS) test strip, Use one strip to test blood sugar as directed, Disp: , Rfl:  .  insulin aspart (NOVOLOG FLEXPEN) 100 UNIT/ML FlexPen, Inject 7-15 Units into the skin 3 (three) times daily with meals. Sliding Scale Insulin, Disp: , Rfl:  .  insulin glargine (LANTUS) 100 UNIT/ML injection, Inject 8 Units into the skin 2 (two) times daily. , Disp: , Rfl:  .  Lidocaine HCl 4 % CREA, Apply topically., Disp: , Rfl:  .  nitroGLYCERIN (NITROSTAT) 0.4 MG SL tablet, Place under the tongue., Disp: , Rfl:  .  oxyCODONE (ROXICODONE) 5 MG immediate release tablet, Take 1 tablet (5 mg total) by mouth every 6 (six) hours as needed., Disp: 12 tablet, Rfl: 0 .  oxyCODONE-acetaminophen (PERCOCET) 5-325 MG tablet, Take 1 tablet by mouth every 4 (four) hours as needed for severe pain., Disp: 20  tablet, Rfl: 0 .  silver sulfADIAZINE (SILVADENE) 1 % cream, Apply pea-sized amount to wound daily. (Patient taking differently: Apply 1 application topically daily. Apply pea-sized amount to wound daily.), Disp: 50 g, Rfl: 0 .  simvastatin (ZOCOR) 40 MG tablet, Take 40 mg by mouth at bedtime., Disp: , Rfl:  .  Skin Protectants, Misc. (EUCERIN) cream, Apply topically., Disp: , Rfl:  .  TechLite Lancets MISC, 1 each by Misc.(Non-Drug; Combo Route) route daily., Disp: , Rfl:  .  traMADol (ULTRAM) 50 MG tablet, Take by mouth., Disp: , Rfl:  .  vitamin B-12 (CYANOCOBALAMIN) 500 MCG tablet, Take 500 mcg by mouth daily., Disp: , Rfl:   Social History   Tobacco Use  Smoking Status Former Smoker  . Years: 15.00  . Quit date: 01/24/1989  . Years since quitting: 30.4   Smokeless Tobacco Never Used    Allergies  Allergen Reactions  . Promethazine Other (See Comments)    Tremors, cold, restlessness.  . Povidone-Iodine Rash   Objective:  There were no vitals filed for this visit. There is no height or weight on file to calculate BMI. Constitutional Well developed. Well nourished.  Vascular Foot warm and well perfused. Capillary refill normal to all digits.   Neurologic Normal speech. Oriented to person, place, and time. Epicritic sensation to light touch grossly present bilaterally.  Dermatologic  incision healing well with granular base at medial ankle. Pin sites without warmth erythema signs of infection.  Orthopedic: No tenderness palpation about the surgical sites.  Ankle position appears anatomic   Radiographs: None Assessment:   1. Post-operative state    Plan:  Patient was evaluated and treated and all questions answered.  S/p foot surgery left -Progressing as expected post-operatively. -XR: None -WB Status: minimal weightbearing (for transfers only) -Sutures: removed today. -Pin sites without signs of acute infection. Pin sites cleansed. -Medications: none refilled. -Xeroform applied around each pin site.  -Discussed f/u in 2 weeks for XR, will discuss possible removal of the fixator thereafter.  No follow-ups on file.

## 2019-07-13 ENCOUNTER — Other Ambulatory Visit: Payer: Self-pay

## 2019-07-13 ENCOUNTER — Other Ambulatory Visit: Payer: Self-pay | Admitting: Podiatry

## 2019-07-13 ENCOUNTER — Ambulatory Visit (INDEPENDENT_AMBULATORY_CARE_PROVIDER_SITE_OTHER): Payer: Medicare Other | Admitting: Podiatry

## 2019-07-13 ENCOUNTER — Ambulatory Visit (INDEPENDENT_AMBULATORY_CARE_PROVIDER_SITE_OTHER): Payer: Medicare Other

## 2019-07-13 DIAGNOSIS — Z9889 Other specified postprocedural states: Secondary | ICD-10-CM

## 2019-07-13 DIAGNOSIS — S8252XA Displaced fracture of medial malleolus of left tibia, initial encounter for closed fracture: Secondary | ICD-10-CM

## 2019-07-13 DIAGNOSIS — M25572 Pain in left ankle and joints of left foot: Secondary | ICD-10-CM

## 2019-07-13 NOTE — Patient Instructions (Signed)
Pre-Operative Instructions  Congratulations, you have decided to take an important step towards improving your quality of life.  You can be assured that the doctors and staff at [facility] will be with you every step of the way.  Here are some important things you should know:  1. Plan to be at the surgery center/hospital at least 1 (one) hour prior to your scheduled time, unless otherwise directed by the surgical center/hospital staff.  You must have a responsible adult accompany you, remain during the surgery and drive you home.  Make sure you have directions to the surgical center/hospital to ensure you arrive on time. 2. If you are having surgery at [facility] or [facility], you will need a copy of your medical history and physical form from your family physician within one month prior to the date of surgery. We will give you a form for your primary physician to complete.  3. We make every effort to accommodate the date you request for surgery.  However, there are times where surgery dates or times have to be moved.  We will contact you as soon as possible if a change in schedule is required.   4. No aspirin/ibuprofen for one week before surgery.  If you are on aspirin, any non-steroidal anti-inflammatory medications (Mobic, Aleve, Ibuprofen) should not be taken seven (7) days prior to your surgery.  You make take Tylenol for pain prior to surgery.  5. Medications - If you are taking daily heart and blood pressure medications, seizure, reflux, allergy, asthma, anxiety, pain or diabetes medications, make sure you notify the surgery center/hospital before the day of surgery so they can tell you which medications you should take or avoid the day of surgery. 6. No food or drink after midnight the night before surgery unless directed otherwise by surgical center/hospital staff. 7. No alcoholic beverages 24-hours prior to surgery.  No smoking 24-hours prior or 24-hours after  surgery. 8. Wear loose pants or shorts. They should be loose enough to fit over bandages, boots, and casts. 9. Don't wear slip-on shoes. Sneakers are preferred. 10. Bring your boot with you to the surgery center/hospital.  Also bring crutches or a walker if your physician has prescribed it for you.  If you do not have this equipment, it will be provided for you after surgery. 11. If you have not been contacted by the surgery center/hospital by the day before your surgery, call to confirm the date and time of your surgery. 12. Leave-time from work may vary depending on the type of surgery you have.  Appropriate arrangements should be made prior to surgery with your employer. 13. Prescriptions will be provided immediately following surgery by your doctor.  Fill these as soon as possible after surgery and take the medication as directed. Pain medications will not be refilled on weekends and must be approved by the doctor. 14. Remove nail polish on the operative foot and avoid getting pedicures prior to surgery. 15. Wash the night before surgery.  The night before surgery wash the foot and leg well with water and the antibacterial soap provided. Be sure to pay special attention to beneath the toenails and in between the toes.  Wash for at least three (3) minutes. Rinse thoroughly with water and dry well with a towel.  Perform this wash unless told not to do so by your physician.  Enclosed: 1 Ice pack (please put in freezer the night before surgery)   1 Hibiclens skin cleaner     Pre-op instructions  If you have any questions regarding the instructions, please do not hesitate to call our office.  [place]: [address], [place], [place] [address] -- [phone]  [place]: [address] [place], [place] [address] -- [phone]  [place]: [address]  [place], [place] [address] -- [phone]   Website: [email or web address] 

## 2019-07-14 DIAGNOSIS — K900 Celiac disease: Secondary | ICD-10-CM | POA: Insufficient documentation

## 2019-07-21 ENCOUNTER — Other Ambulatory Visit: Payer: Self-pay | Admitting: Podiatry

## 2019-07-21 DIAGNOSIS — Z9889 Other specified postprocedural states: Secondary | ICD-10-CM

## 2019-07-27 ENCOUNTER — Encounter: Payer: Medicare Other | Admitting: Podiatry

## 2019-07-27 ENCOUNTER — Other Ambulatory Visit: Payer: Self-pay | Admitting: Podiatry

## 2019-07-27 DIAGNOSIS — S82842D Displaced bimalleolar fracture of left lower leg, subsequent encounter for closed fracture with routine healing: Secondary | ICD-10-CM

## 2019-07-27 DIAGNOSIS — E11621 Type 2 diabetes mellitus with foot ulcer: Secondary | ICD-10-CM

## 2019-07-27 DIAGNOSIS — S8252XA Displaced fracture of medial malleolus of left tibia, initial encounter for closed fracture: Secondary | ICD-10-CM | POA: Diagnosis not present

## 2019-07-27 MED ORDER — OXYCODONE HCL 5 MG PO TABS: 5.0000 mg | ORAL_TABLET | Freq: Four times a day (QID) | ORAL | 0 refills | Status: AC | PRN

## 2019-07-27 MED ORDER — CLINDAMYCIN HCL 300 MG PO CAPS: 300.0000 mg | ORAL_CAPSULE | Freq: Two times a day (BID) | ORAL | 0 refills | Status: AC

## 2019-07-27 NOTE — Progress Notes (Signed)
Rx sent to pharmacy for outpatient surgery. °

## 2019-07-29 ENCOUNTER — Encounter: Payer: Self-pay | Admitting: Podiatry

## 2019-07-29 ENCOUNTER — Telehealth: Payer: Self-pay

## 2019-07-29 NOTE — Telephone Encounter (Signed)
Called Pt to to over on how he's doing after surgery. Pt did not answer phone call. LVM stating to give [place] a call back to see how he's doing.

## 2019-07-29 NOTE — Progress Notes (Signed)
DOS: 07/27/2019  Procedure: Left ankle removal of fixator, manipulation and cast application. Included debridement of pin sides and closure if indicated

## 2019-08-03 ENCOUNTER — Other Ambulatory Visit: Payer: Self-pay | Admitting: Podiatry

## 2019-08-03 ENCOUNTER — Ambulatory Visit (INDEPENDENT_AMBULATORY_CARE_PROVIDER_SITE_OTHER): Payer: Medicare Other

## 2019-08-03 ENCOUNTER — Telehealth: Payer: Self-pay | Admitting: *Deleted

## 2019-08-03 ENCOUNTER — Ambulatory Visit (INDEPENDENT_AMBULATORY_CARE_PROVIDER_SITE_OTHER): Payer: Medicare Other | Admitting: Podiatry

## 2019-08-03 ENCOUNTER — Other Ambulatory Visit: Payer: Self-pay

## 2019-08-03 DIAGNOSIS — R600 Localized edema: Secondary | ICD-10-CM

## 2019-08-03 DIAGNOSIS — Z9889 Other specified postprocedural states: Secondary | ICD-10-CM

## 2019-08-03 DIAGNOSIS — Z992 Dependence on renal dialysis: Secondary | ICD-10-CM

## 2019-08-03 DIAGNOSIS — E0851 Diabetes mellitus due to underlying condition with diabetic peripheral angiopathy without gangrene: Secondary | ICD-10-CM

## 2019-08-03 DIAGNOSIS — N186 End stage renal disease: Secondary | ICD-10-CM | POA: Diagnosis not present

## 2019-08-03 DIAGNOSIS — Z794 Long term (current) use of insulin: Secondary | ICD-10-CM

## 2019-08-03 DIAGNOSIS — S82842D Displaced bimalleolar fracture of left lower leg, subsequent encounter for closed fracture with routine healing: Secondary | ICD-10-CM

## 2019-08-03 DIAGNOSIS — S82842A Displaced bimalleolar fracture of left lower leg, initial encounter for closed fracture: Secondary | ICD-10-CM

## 2019-08-03 DIAGNOSIS — I739 Peripheral vascular disease, unspecified: Secondary | ICD-10-CM

## 2019-08-03 NOTE — Telephone Encounter (Signed)
-----   Message from Evelina Bucy, DPM sent at 08/03/2019 10:13 AM EST ----- Can we order [place] for nursing and PT. Instructions in note. Thanks!

## 2019-08-03 NOTE — Progress Notes (Signed)
Subjective:  Patient ID: John Ortiz, male    DOB: 06-Sep-1945,  MRN: [id number]  No chief complaint on file.  DOS: 04/27/2019 Procedure: Left Ankle ORIF, application of External fixator  73 y.o. male returns for post-op check.  Doing well but ready for the fixator to be removed.  Review of Systems: Negative except as noted in the HPI. Denies N/V/F/Ch.  Past Medical History:  Diagnosis Date  . Anemia in CKD (chronic kidney disease)   . Aneurysm (HCC)    Left arm  . Arthritis   . CHF (congestive heart failure) ([place])   . Chronic kidney disease, stage V ([place])    MWF- [place]  . Coagulation defect ([place])   . Coronary artery disease   . Diabetes mellitus type 2, uncomplicated ([place])   . Diarrhea   . Fluid overload   . GERD (gastroesophageal reflux disease)   . Heart murmur   . Hypercalcemia   . Iron deficiency anemia, unspecified   . Non-healing ulcer of foot ([place])    Right foot  . Pain   . Pruritus   . Renal osteodystrophy   . Secondary hyperparathyroidism of renal origin ([place])   . Shortness of breath   . Type 2 diabetes mellitus with hypoglycemia without coma ([place])   . Unspecified protein-calorie malnutrition ([place])     Current Outpatient Medications:  .  acetaminophen (TYLENOL) 500 MG tablet, Take 1,000 mg by mouth at bedtime as needed for moderate pain., Disp: , Rfl:  .  aspirin EC 81 MG tablet, Take 81 mg by mouth at bedtime. , Disp: , Rfl:  .  B Complex-C-Folic Acid (DIALYVITE 786) 0.8 MG TABS, Take 1 tablet by mouth daily with lunch. , Disp: , Rfl:  .  calcium acetate (PHOSLO) 667 MG capsule, Take 2,001 mg by mouth 3 (three) times daily with meals., Disp: , Rfl:  .  clindamycin (CLEOCIN) 300 MG capsule, Take 1 capsule (300 mg total) by mouth 2 (two) times daily., Disp: 14 capsule, Rfl: 0 .  clopidogrel (PLAVIX) 75 MG tablet, Take by mouth., Disp: , Rfl:  .  FLUoxetine (PROZAC) 20 MG capsule, Take 20 mg by mouth daily., Disp: , Rfl:  .  furosemide (LASIX) 80 MG  tablet, Take 80 mg by mouth 2 (two) times daily., Disp: , Rfl:  .  glucosamine-chondroitin 500-400 MG tablet, Take by mouth., Disp: , Rfl:  .  glucose 4 GM chewable tablet, Chew 1-5 tablets by mouth as needed for low blood sugar., Disp: , Rfl:  .  glucose blood (PRECISION XTRA TEST STRIPS) test strip, Use one strip to test blood sugar as directed, Disp: , Rfl:  .  insulin aspart (NOVOLOG FLEXPEN) 100 UNIT/ML FlexPen, Inject 7-15 Units into the skin 3 (three) times daily with meals. Sliding Scale Insulin, Disp: , Rfl:  .  insulin glargine (LANTUS) 100 UNIT/ML injection, Inject 8 Units into the skin 2 (two) times daily. , Disp: , Rfl:  .  Lidocaine HCl 4 % CREA, Apply topically., Disp: , Rfl:  .  nitroGLYCERIN (NITROSTAT) 0.4 MG SL tablet, Place under the tongue., Disp: , Rfl:  .  oxyCODONE (ROXICODONE) 5 MG immediate release tablet, Take 1 tablet (5 mg total) by mouth every 6 (six) hours as needed., Disp: 12 tablet, Rfl: 0 .  oxyCODONE-acetaminophen (PERCOCET) 5-325 MG tablet, Take 1 tablet by mouth every 4 (four) hours as needed for severe pain., Disp: 20 tablet, Rfl: 0 .  silver sulfADIAZINE (SILVADENE) 1 % cream, Apply pea-sized  amount to wound daily. (Patient taking differently: Apply 1 application topically daily. Apply pea-sized amount to wound daily.), Disp: 50 g, Rfl: 0 .  simvastatin (ZOCOR) 40 MG tablet, Take 40 mg by mouth at bedtime., Disp: , Rfl:  .  Skin Protectants, Misc. (EUCERIN) cream, Apply topically., Disp: , Rfl:  .  TechLite Lancets MISC, 1 each by Misc.(Non-Drug; Combo Route) route daily., Disp: , Rfl:  .  traMADol (ULTRAM) 50 MG tablet, Take by mouth., Disp: , Rfl:  .  vitamin B-12 (CYANOCOBALAMIN) 500 MCG tablet, Take 500 mcg by mouth daily., Disp: , Rfl:   Social History   Tobacco Use  Smoking Status Former Smoker  . Years: 15.00  . Quit date: 01/24/1989  . Years since quitting: 30.5  Smokeless Tobacco Never Used    Allergies  Allergen Reactions  . Promethazine  Other (See Comments)    Tremors, cold, restlessness.  . Povidone-Iodine Rash   Objective:  There were no vitals filed for this visit. There is no height or weight on file to calculate BMI. Constitutional Well developed. Well nourished.  Vascular Foot warm and well perfused. Capillary refill normal to all digits.   Neurologic Normal speech. Oriented to person, place, and time. Epicritic sensation to light touch grossly present bilaterally.  Dermatologic Incision healing at medial ankle with mild fibrosis. Pin sites without warmth erythema signs of infection.  Orthopedic: No tenderness palpation about the surgical sites.  Ankle position appears anatomic   Radiographs: None Assessment:   1. Post-operative state    Plan:  Patient was evaluated and treated and all questions answered.  S/p foot surgery left -Progressing as expected post-operatively. -XR: None -WB Status: minimal weightbearing (for transfers only) -Pin sites without signs of acute infection. Pin sites cleansed. -Medications: none refilled. -Xeroform applied around each pin site.  Plan for removal of the external fixator with cast application.  -Patient has failed all conservative therapy and wishes to proceed with surgical intervention. All risks, benefits, and alternatives discussed with patient. No guarantees given. Consent reviewed and signed by patient. -Planned procedures: Removal of external fixator left, cast application, debridement of all open wounds.   No follow-ups on file.

## 2019-08-03 NOTE — Telephone Encounter (Signed)
[facility] - John Ortiz states pt was discharge in 05/2019. I faxed Dr. Eleanora Neighbor 08/03/2019 10:29am orders and required form, clinicals and demographics to [facility].

## 2019-08-03 NOTE — Progress Notes (Signed)
Subjective:  Patient ID: John Ortiz, male    DOB: September 30, 1945,  MRN: [id number]  No chief complaint on file.  DOS: 07/27/2019 Procedure: Removal of external fixator, wound debridement, cast application  73 y.o. male returns for post-op check. Doing well had some pain from the cast that prevented WB with transfers. Presents in wheelchair in good spirits.  Review of Systems: Negative except as noted in the HPI. Denies N/V/F/Ch.  Past Medical History:  Diagnosis Date   Anemia in CKD (chronic kidney disease)    Aneurysm (HCC)    Left arm   Arthritis    CHF (congestive heart failure) (HCC)    Chronic kidney disease, stage V (HCC)    MWF- [place]   Coagulation defect ([place])    Coronary artery disease    Diabetes mellitus type 2, uncomplicated (HCC)    Diarrhea    Fluid overload    GERD (gastroesophageal reflux disease)    Heart murmur    Hypercalcemia    Iron deficiency anemia, unspecified    Non-healing ulcer of foot (HCC)    Right foot   Pain    Pruritus    Renal osteodystrophy    Secondary hyperparathyroidism of renal origin ([place])    Shortness of breath    Type 2 diabetes mellitus with hypoglycemia without coma (HCC)    Unspecified protein-calorie malnutrition (HCC)     Current Outpatient Medications:    acetaminophen (TYLENOL) 500 MG tablet, Take 1,000 mg by mouth at bedtime as needed for moderate pain., Disp: , Rfl:    aspirin EC 81 MG tablet, Take 81 mg by mouth at bedtime. , Disp: , Rfl:    B Complex-C-Folic Acid (DIALYVITE 568) 0.8 MG TABS, Take 1 tablet by mouth daily with lunch. , Disp: , Rfl:    calcium acetate (PHOSLO) 667 MG capsule, Take 2,001 mg by mouth 3 (three) times daily with meals., Disp: , Rfl:    clindamycin (CLEOCIN) 300 MG capsule, Take 1 capsule (300 mg total) by mouth 2 (two) times daily., Disp: 14 capsule, Rfl: 0   clopidogrel (PLAVIX) 75 MG tablet, Take by mouth., Disp: , Rfl:    FLUoxetine (PROZAC) 20 MG  capsule, Take 20 mg by mouth daily., Disp: , Rfl:    furosemide (LASIX) 80 MG tablet, Take 80 mg by mouth 2 (two) times daily., Disp: , Rfl:    glucosamine-chondroitin 500-400 MG tablet, Take by mouth., Disp: , Rfl:    glucose 4 GM chewable tablet, Chew 1-5 tablets by mouth as needed for low blood sugar., Disp: , Rfl:    glucose blood (PRECISION XTRA TEST STRIPS) test strip, Use one strip to test blood sugar as directed, Disp: , Rfl:    insulin aspart (NOVOLOG FLEXPEN) 100 UNIT/ML FlexPen, Inject 7-15 Units into the skin 3 (three) times daily with meals. Sliding Scale Insulin, Disp: , Rfl:    insulin glargine (LANTUS) 100 UNIT/ML injection, Inject 8 Units into the skin 2 (two) times daily. , Disp: , Rfl:    Lidocaine HCl 4 % CREA, Apply topically., Disp: , Rfl:    nitroGLYCERIN (NITROSTAT) 0.4 MG SL tablet, Place under the tongue., Disp: , Rfl:    oxyCODONE (ROXICODONE) 5 MG immediate release tablet, Take 1 tablet (5 mg total) by mouth every 6 (six) hours as needed., Disp: 12 tablet, Rfl: 0   oxyCODONE-acetaminophen (PERCOCET) 5-325 MG tablet, Take 1 tablet by mouth every 4 (four) hours as needed for severe pain., Disp: 20 tablet, Rfl: 0  silver sulfADIAZINE (SILVADENE) 1 % cream, Apply pea-sized amount to wound daily. (Patient taking differently: Apply 1 application topically daily. Apply pea-sized amount to wound daily.), Disp: 50 g, Rfl: 0   simvastatin (ZOCOR) 40 MG tablet, Take 40 mg by mouth at bedtime., Disp: , Rfl:    Skin Protectants, Misc. (EUCERIN) cream, Apply topically., Disp: , Rfl:    TechLite Lancets MISC, 1 each by Misc.(Non-Drug; Combo Route) route daily., Disp: , Rfl:    traMADol (ULTRAM) 50 MG tablet, Take by mouth., Disp: , Rfl:    vitamin B-12 (CYANOCOBALAMIN) 500 MCG tablet, Take 500 mcg by mouth daily., Disp: , Rfl:   Social History   Tobacco Use  Smoking Status Former Smoker   Years: 15.00   Quit date: 01/24/1989   Years since quitting: 30.5    Smokeless Tobacco Never Used    Allergies  Allergen Reactions   Promethazine Other (See Comments)    Tremors, cold, restlessness.   Povidone-Iodine Rash   Objective:  There were no vitals filed for this visit. There is no height or weight on file to calculate BMI. Constitutional Well developed. Well nourished.  Vascular Foot warm and well perfused. Capillary refill normal to all digits.   Neurologic Normal speech. Oriented to person, place, and time. Epicritic sensation to light touch grossly present bilaterally.  Dermatologic Skin healing well without signs of infection. Edema noted  Orthopedic: Tenderness to palpation noted about the ankle. Improved ankle alignment c/t pre-op.   Radiographs: XR reviewed. Fractures appear healed. Anterior displcement of the talus noted with arthritic ankle changes.  Assessment:   1. Closed displaced bimalleolar fracture of left lower leg with routine healing   2. Post-operative state   3. Diabetes mellitus due to underlying condition with diabetic peripheral angiopathy without gangrene, with long-term current use of insulin ([place])   4. ESRD (end stage renal disease) on dialysis ([place])   5. Bimalleolar fracture of left ankle, closed, initial encounter   6. PAD (peripheral artery disease) ([place])    Plan:  Patient was evaluated and treated and all questions answered.  S/p foot surgery left -Wounds healing well. Unna boot applied for reduction of edema. -Order [facility] - Silvadene and xeroform to pin sites and incision medial ankle -Order [facility] PT - mobilization training, strengthening. He presents a high fall risk. -XR reviewed. Positioning is not ideal, but given patient's significant comorbidities including ESRD, DM, neuropathy and PAD with calcifications hopefully he can ambulate pain free. Will start protected WB in Boot and walker. PT training. He may benefit from rod fixation of the ankle joint should pain persist.  No follow-ups on file.

## 2019-08-05 DIAGNOSIS — Z4801 Encounter for change or removal of surgical wound dressing: Secondary | ICD-10-CM | POA: Diagnosis not present

## 2019-08-05 DIAGNOSIS — E1122 Type 2 diabetes mellitus with diabetic chronic kidney disease: Secondary | ICD-10-CM

## 2019-08-05 DIAGNOSIS — S82842D Displaced bimalleolar fracture of left lower leg, subsequent encounter for closed fracture with routine healing: Secondary | ICD-10-CM

## 2019-08-06 NOTE — Telephone Encounter (Signed)
[facility] - John Ortiz states pt is to be accepted into nursing and would like to know if Dr. March Rummage would sign the orders.

## 2019-08-06 NOTE — Telephone Encounter (Signed)
I told John Ortiz - [facility] Dr. March Rummage would sign the podiatry orders.

## 2019-08-10 ENCOUNTER — Other Ambulatory Visit: Payer: Self-pay

## 2019-08-10 ENCOUNTER — Telehealth: Payer: Self-pay | Admitting: *Deleted

## 2019-08-10 ENCOUNTER — Ambulatory Visit (INDEPENDENT_AMBULATORY_CARE_PROVIDER_SITE_OTHER): Payer: Medicare Other | Admitting: Podiatry

## 2019-08-10 DIAGNOSIS — Z9889 Other specified postprocedural states: Secondary | ICD-10-CM

## 2019-08-10 DIAGNOSIS — S82842D Displaced bimalleolar fracture of left lower leg, subsequent encounter for closed fracture with routine healing: Secondary | ICD-10-CM

## 2019-08-10 NOTE — Telephone Encounter (Signed)
Duplicate

## 2019-08-10 NOTE — Telephone Encounter (Signed)
-----   Message from Evelina Bucy, DPM sent at 08/03/2019 10:13 AM EST ----- Can we order [place] for nursing and PT. Instructions in note. Thanks!

## 2019-08-12 ENCOUNTER — Telehealth: Payer: Self-pay | Admitting: *Deleted

## 2019-08-12 NOTE — Telephone Encounter (Signed)
[facility] - John Ortiz called for clarification of [place] orders.

## 2019-08-13 ENCOUNTER — Other Ambulatory Visit: Payer: Self-pay | Admitting: Podiatry

## 2019-08-13 DIAGNOSIS — S82842D Displaced bimalleolar fracture of left lower leg, subsequent encounter for closed fracture with routine healing: Secondary | ICD-10-CM

## 2019-08-16 DIAGNOSIS — R519 Headache, unspecified: Secondary | ICD-10-CM | POA: Insufficient documentation

## 2019-08-23 DIAGNOSIS — G510 Bell's palsy: Secondary | ICD-10-CM | POA: Insufficient documentation

## 2019-08-24 ENCOUNTER — Ambulatory Visit (INDEPENDENT_AMBULATORY_CARE_PROVIDER_SITE_OTHER): Payer: Medicare Other | Admitting: Podiatry

## 2019-08-24 ENCOUNTER — Other Ambulatory Visit: Payer: Self-pay

## 2019-08-24 DIAGNOSIS — R600 Localized edema: Secondary | ICD-10-CM | POA: Diagnosis not present

## 2019-08-24 DIAGNOSIS — Z9889 Other specified postprocedural states: Secondary | ICD-10-CM

## 2019-08-24 NOTE — Progress Notes (Signed)
Subjective:  Patient ID: John Ortiz, male    DOB: 1945-12-19,  MRN: [id number]  Chief Complaint  Patient presents with  . Routine Post Op    POV Pt. states," trying to heal up , mostly very sore, I can't make it go down, it's still very swollen; 5/10."   . Diabetes    FBS: 190 a1C; >7   DOS: 07/27/2019 Procedure: Removal of external fixator, wound debridement, cast application  73 y.o. male returns for post-op check. Working with PT only having pain at the high part of his leg. Otherwise he is having continued swelling. Review of Systems: Negative except as noted in the HPI. Denies N/V/F/Ch.  Past Medical History:  Diagnosis Date  . Anemia in CKD (chronic kidney disease)   . Aneurysm (HCC)    Left arm  . Arthritis   . CHF (congestive heart failure) ([place])   . Chronic kidney disease, stage V ([place])    MWF- [place]  . Coagulation defect ([place])   . Coronary artery disease   . Diabetes mellitus type 2, uncomplicated ([place])   . Diarrhea   . Fluid overload   . GERD (gastroesophageal reflux disease)   . Heart murmur   . Hypercalcemia   . Iron deficiency anemia, unspecified   . Non-healing ulcer of foot ([place])    Right foot  . Pain   . Pruritus   . Renal osteodystrophy   . Secondary hyperparathyroidism of renal origin ([place])   . Shortness of breath   . Type 2 diabetes mellitus with hypoglycemia without coma ([place])   . Unspecified protein-calorie malnutrition ([place])     Current Outpatient Medications:  .  acetaminophen (TYLENOL) 500 MG tablet, Take 1,000 mg by mouth at bedtime as needed for moderate pain., Disp: , Rfl:  .  aspirin EC 81 MG tablet, Take 81 mg by mouth at bedtime. , Disp: , Rfl:  .  B Complex-C-Folic Acid (DIALYVITE 564) 0.8 MG TABS, Take 1 tablet by mouth daily with lunch. , Disp: , Rfl:  .  calcium acetate (PHOSLO) 667 MG capsule, Take 2,001 mg by mouth 3 (three) times daily with meals., Disp: , Rfl:  .  clindamycin (CLEOCIN) 300 MG capsule, Take 1 capsule  (300 mg total) by mouth 2 (two) times daily., Disp: 14 capsule, Rfl: 0 .  clopidogrel (PLAVIX) 75 MG tablet, Take by mouth., Disp: , Rfl:  .  FLUoxetine (PROZAC) 20 MG capsule, Take 20 mg by mouth daily., Disp: , Rfl:  .  furosemide (LASIX) 80 MG tablet, Take 80 mg by mouth 2 (two) times daily., Disp: , Rfl:  .  glucosamine-chondroitin 500-400 MG tablet, Take by mouth., Disp: , Rfl:  .  glucose 4 GM chewable tablet, Chew 1-5 tablets by mouth as needed for low blood sugar., Disp: , Rfl:  .  glucose blood (PRECISION XTRA TEST STRIPS) test strip, Use one strip to test blood sugar as directed, Disp: , Rfl:  .  insulin aspart (NOVOLOG FLEXPEN) 100 UNIT/ML FlexPen, Inject 7-15 Units into the skin 3 (three) times daily with meals. Sliding Scale Insulin, Disp: , Rfl:  .  insulin glargine (LANTUS) 100 UNIT/ML injection, Inject 8 Units into the skin 2 (two) times daily. , Disp: , Rfl:  .  Lidocaine HCl 4 % CREA, Apply topically., Disp: , Rfl:  .  nitroGLYCERIN (NITROSTAT) 0.4 MG SL tablet, Place under the tongue., Disp: , Rfl:  .  oxyCODONE (ROXICODONE) 5 MG immediate release tablet, Take 1 tablet (5  mg total) by mouth every 6 (six) hours as needed., Disp: 12 tablet, Rfl: 0 .  oxyCODONE-acetaminophen (PERCOCET) 5-325 MG tablet, Take 1 tablet by mouth every 4 (four) hours as needed for severe pain., Disp: 20 tablet, Rfl: 0 .  silver sulfADIAZINE (SILVADENE) 1 % cream, Apply pea-sized amount to wound daily. (Patient taking differently: Apply 1 application topically daily. Apply pea-sized amount to wound daily.), Disp: 50 g, Rfl: 0 .  simvastatin (ZOCOR) 40 MG tablet, Take 40 mg by mouth at bedtime., Disp: , Rfl:  .  Skin Protectants, Misc. (EUCERIN) cream, Apply topically., Disp: , Rfl:  .  TechLite Lancets MISC, 1 each by Misc.(Non-Drug; Combo Route) route daily., Disp: , Rfl:  .  traMADol (ULTRAM) 50 MG tablet, Take by mouth., Disp: , Rfl:  .  vitamin B-12 (CYANOCOBALAMIN) 500 MCG tablet, Take 500 mcg by  mouth daily., Disp: , Rfl:   Social History   Tobacco Use  Smoking Status Former Smoker  . Years: 15.00  . Quit date: 01/24/1989  . Years since quitting: 30.6  Smokeless Tobacco Never Used    Allergies  Allergen Reactions  . Promethazine Other (See Comments)    Tremors, cold, restlessness.  . Povidone-Iodine Rash   Objective:  There were no vitals filed for this visit. There is no height or weight on file to calculate BMI. Constitutional Well developed. Well nourished.  Vascular Foot warm and well perfused. Capillary refill normal to all digits.   Neurologic Normal speech. Oriented to person, place, and time. Epicritic sensation to light touch grossly present bilaterally.  Dermatologic Skin healed  Orthopedic: Tenderness to palpation noted about the ankle. Improved ankle alignment c/t pre-op.   Radiographs: None Assessment:   1. Localized edema   2. Post-operative state    Plan:  Patient was evaluated and treated and all questions answered.  S/p foot surgery left -Unna boot applied today for reduction of edema. -Order [facility]HC - Unna boot twice weekly for reduction of edema -Order [facility] PT - mobilization training, strengthening. He presents a high fall risk. -Patient not having pain when ambulating currently. Ankle positioning a little off but I hope it will prove adequate. Otherwise he will need arthrodesis with rod fixation. Continue WBAT in [place] and working with PT.  No follow-ups on file.

## 2019-08-24 NOTE — Progress Notes (Signed)
Subjective:  Patient ID: John Ortiz, male    DOB: [birth year],  MRN: [id number]  Chief Complaint  Patient presents with  . Routine Post Op    Pt. states," the only sore pat is at the fron of my legt (anterior shin) and my knee is very sore; 3-4/10 sorness." -sam ewith swellign Tx: PT    DOS: 07/27/2019 Procedure: Removal of external fixator, wound debridement, cast application  73 y.o. male returns for post-op check. Denies pain at the ankle. Working with PT.  Review of Systems: Negative except as noted in the HPI. Denies N/V/F/Ch.  Past Medical History:  Diagnosis Date  . Anemia in CKD (chronic kidney disease)   . Aneurysm (HCC)    Left arm  . Arthritis   . CHF (congestive heart failure) ([place])   . Chronic kidney disease, stage V ([place])    MWF- [facility]  . Coagulation defect ([place])   . Coronary artery disease   . Diabetes mellitus type 2, uncomplicated ([place])   . Diarrhea   . Fluid overload   . GERD (gastroesophageal reflux disease)   . Heart murmur   . Hypercalcemia   . Iron deficiency anemia, unspecified   . Non-healing ulcer of foot ([place])    Right foot  . Pain   . Pruritus   . Renal osteodystrophy   . Secondary hyperparathyroidism of renal origin ([place])   . Shortness of breath   . Type 2 diabetes mellitus with hypoglycemia without coma ([place])   . Unspecified protein-calorie malnutrition ([place])     Current Outpatient Medications:  .  acetaminophen (TYLENOL) 500 MG tablet, Take 1,000 mg by mouth at bedtime as needed for moderate pain., Disp: , Rfl:  .  aspirin EC 81 MG tablet, Take 81 mg by mouth at bedtime. , Disp: , Rfl:  .  B Complex-C-Folic Acid (DIALYVITE 017) 0.8 MG TABS, Take 1 tablet by mouth daily with lunch. , Disp: , Rfl:  .  calcium acetate (PHOSLO) 667 MG capsule, Take 2,001 mg by mouth 3 (three) times daily with meals., Disp: , Rfl:  .  clindamycin (CLEOCIN) 300 MG capsule, Take 1 capsule (300 mg total) by mouth 2 (two) times daily., Disp: 14 capsule,  Rfl: 0 .  clopidogrel (PLAVIX) 75 MG tablet, Take by mouth., Disp: , Rfl:  .  FLUoxetine (PROZAC) 20 MG capsule, Take 20 mg by mouth daily., Disp: , Rfl:  .  furosemide (LASIX) 80 MG tablet, Take 80 mg by mouth 2 (two) times daily., Disp: , Rfl:  .  glucosamine-chondroitin 500-400 MG tablet, Take by mouth., Disp: , Rfl:  .  glucose 4 GM chewable tablet, Chew 1-5 tablets by mouth as needed for low blood sugar., Disp: , Rfl:  .  glucose blood (PRECISION XTRA TEST STRIPS) test strip, Use one strip to test blood sugar as directed, Disp: , Rfl:  .  insulin aspart (NOVOLOG FLEXPEN) 100 UNIT/ML FlexPen, Inject 7-15 Units into the skin 3 (three) times daily with meals. Sliding Scale Insulin, Disp: , Rfl:  .  insulin glargine (LANTUS) 100 UNIT/ML injection, Inject 8 Units into the skin 2 (two) times daily. , Disp: , Rfl:  .  Lidocaine HCl 4 % CREA, Apply topically., Disp: , Rfl:  .  nitroGLYCERIN (NITROSTAT) 0.4 MG SL tablet, Place under the tongue., Disp: , Rfl:  .  oxyCODONE (ROXICODONE) 5 MG immediate release tablet, Take 1 tablet (5 mg total) by mouth every 6 (six) hours as needed., Disp: 12 tablet, Rfl:  0 .  oxyCODONE-acetaminophen (PERCOCET) 5-325 MG tablet, Take 1 tablet by mouth every 4 (four) hours as needed for severe pain., Disp: 20 tablet, Rfl: 0 .  silver sulfADIAZINE (SILVADENE) 1 % cream, Apply pea-sized amount to wound daily. (Patient taking differently: Apply 1 application topically daily. Apply pea-sized amount to wound daily.), Disp: 50 g, Rfl: 0 .  simvastatin (ZOCOR) 40 MG tablet, Take 40 mg by mouth at bedtime., Disp: , Rfl:  .  Skin Protectants, Misc. (EUCERIN) cream, Apply topically., Disp: , Rfl:  .  TechLite Lancets MISC, 1 each by Misc.(Non-Drug; Combo Route) route daily., Disp: , Rfl:  .  traMADol (ULTRAM) 50 MG tablet, Take by mouth., Disp: , Rfl:  .  vitamin B-12 (CYANOCOBALAMIN) 500 MCG tablet, Take 500 mcg by mouth daily., Disp: , Rfl:   Social History   Tobacco Use    Smoking Status Former Smoker  . Years: 15.00  . Quit date: 01/24/1989  . Years since quitting: 30.6  Smokeless Tobacco Never Used    Allergies  Allergen Reactions  . Promethazine Other (See Comments)    Tremors, cold, restlessness.  . Povidone-Iodine Rash   Objective:  There were no vitals filed for this visit. There is no height or weight on file to calculate BMI. Constitutional Well developed. Well nourished.  Vascular Foot warm and well perfused. Capillary refill normal to all digits.   Neurologic Normal speech. Oriented to person, place, and time. Epicritic sensation to light touch grossly present bilaterally.  Dermatologic Skin healed  Orthopedic: Tenderness to palpation noted about the ankle. Improved ankle alignment c/t pre-op.   Radiographs: None Assessment:   1. Closed displaced bimalleolar fracture of left lower leg with routine healing   2. Post-operative state    Plan:  Patient was evaluated and treated and all questions answered.  S/p foot surgery left -Wounds appear to be healed continue weight-bear as tolerated with PT and has walking boot follow-up in 2 weeks for recheck.  No follow-ups on file.

## 2019-09-07 ENCOUNTER — Other Ambulatory Visit: Payer: Self-pay

## 2019-09-07 ENCOUNTER — Ambulatory Visit (INDEPENDENT_AMBULATORY_CARE_PROVIDER_SITE_OTHER): Payer: Medicare Other | Admitting: Podiatry

## 2019-09-07 DIAGNOSIS — S82842G Displaced bimalleolar fracture of left lower leg, subsequent encounter for closed fracture with delayed healing: Secondary | ICD-10-CM

## 2019-09-07 DIAGNOSIS — S82842D Displaced bimalleolar fracture of left lower leg, subsequent encounter for closed fracture with routine healing: Secondary | ICD-10-CM

## 2019-09-07 DIAGNOSIS — Z9889 Other specified postprocedural states: Secondary | ICD-10-CM

## 2019-09-07 DIAGNOSIS — M14672 Charcot's joint, left ankle and foot: Secondary | ICD-10-CM

## 2019-09-07 MED ORDER — OXYCODONE-ACETAMINOPHEN 5-325 MG PO TABS: 1.0000 | ORAL_TABLET | ORAL | 0 refills | Status: DC | PRN

## 2019-09-07 NOTE — Progress Notes (Signed)
  Subjective:  Patient ID: John Ortiz, male    DOB: Jul 15, 1946,  MRN: [id number]  Chief Complaint  Patient presents with  . Routine Post Op    Pt. states,: pain form 4-7/10, more painful when I do PT, it's very sore and tender to the touch in my leg. I think I need to do more walking each day." -w/ swellging Tx: boot, PT and unna boot    DOS: 04/27/2019 Procedure: Left Ankle ORIF, application of External fixator  DOS: 07/27/2019  Procedure: Left ankle removal of fixator  74 y.o. male presents with the above complaint. History confirmed with patient.   Objective:  Left foot with edema, medial ankle with edema, warmth and erythema that reduces with elevation. No pain to palpation at distal ankle. Mild visible ankle valgus deformity. Decreased ankle ROM but compensatory hindfoot ROM with good DF/PF.  Assessment:   1. Post-operative state   2. Closed displaced bimalleolar fracture of left ankle with delayed healing   3. Charcot ankle, left   4. Closed displaced bimalleolar fracture of left lower leg with routine healing     Plan:  Patient was evaluated and treated and all questions answered.  Post-operative State -Patient with erythema that reduces with elevation. Concerning for Charcot process rather than cellulitis. Discussed etiology. Discussed importance of wearing boot at all times while WB.  -Unna boot applied for reduction of edema and for additional immobilization. -No open ulcerations. -With new concern for Charcot process the liklihood of reconstruction is higher but will await quiescence. Will monitor -XR at next visit.  Return in about 2 weeks (around 09/21/2019) for Post-Op (with XRs).

## 2019-09-08 ENCOUNTER — Telehealth: Payer: Self-pay | Admitting: *Deleted

## 2019-09-08 NOTE — Telephone Encounter (Signed)
Unna boots d/t edema twice weekly.  Continue home PT

## 2019-09-08 NOTE — Telephone Encounter (Signed)
[facility] - John Ortiz states [facility] was started for pt in 07/2019, but currently there are no wounds, please advise.

## 2019-09-09 NOTE — Telephone Encounter (Signed)
I informed [facility] - Earnest Bailey of Dr. March Rummage 09/08/2019 3:43pm orders.

## 2019-09-14 IMAGING — DX PORTABLE CHEST - 1 VIEW
1 series · 1 of 1 positions shown · non-contrast
Comparison: None.

CLINICAL DATA: Status post dialysis catheter placement.

EXAM:
PORTABLE CHEST 1 VIEW

[chest ap]
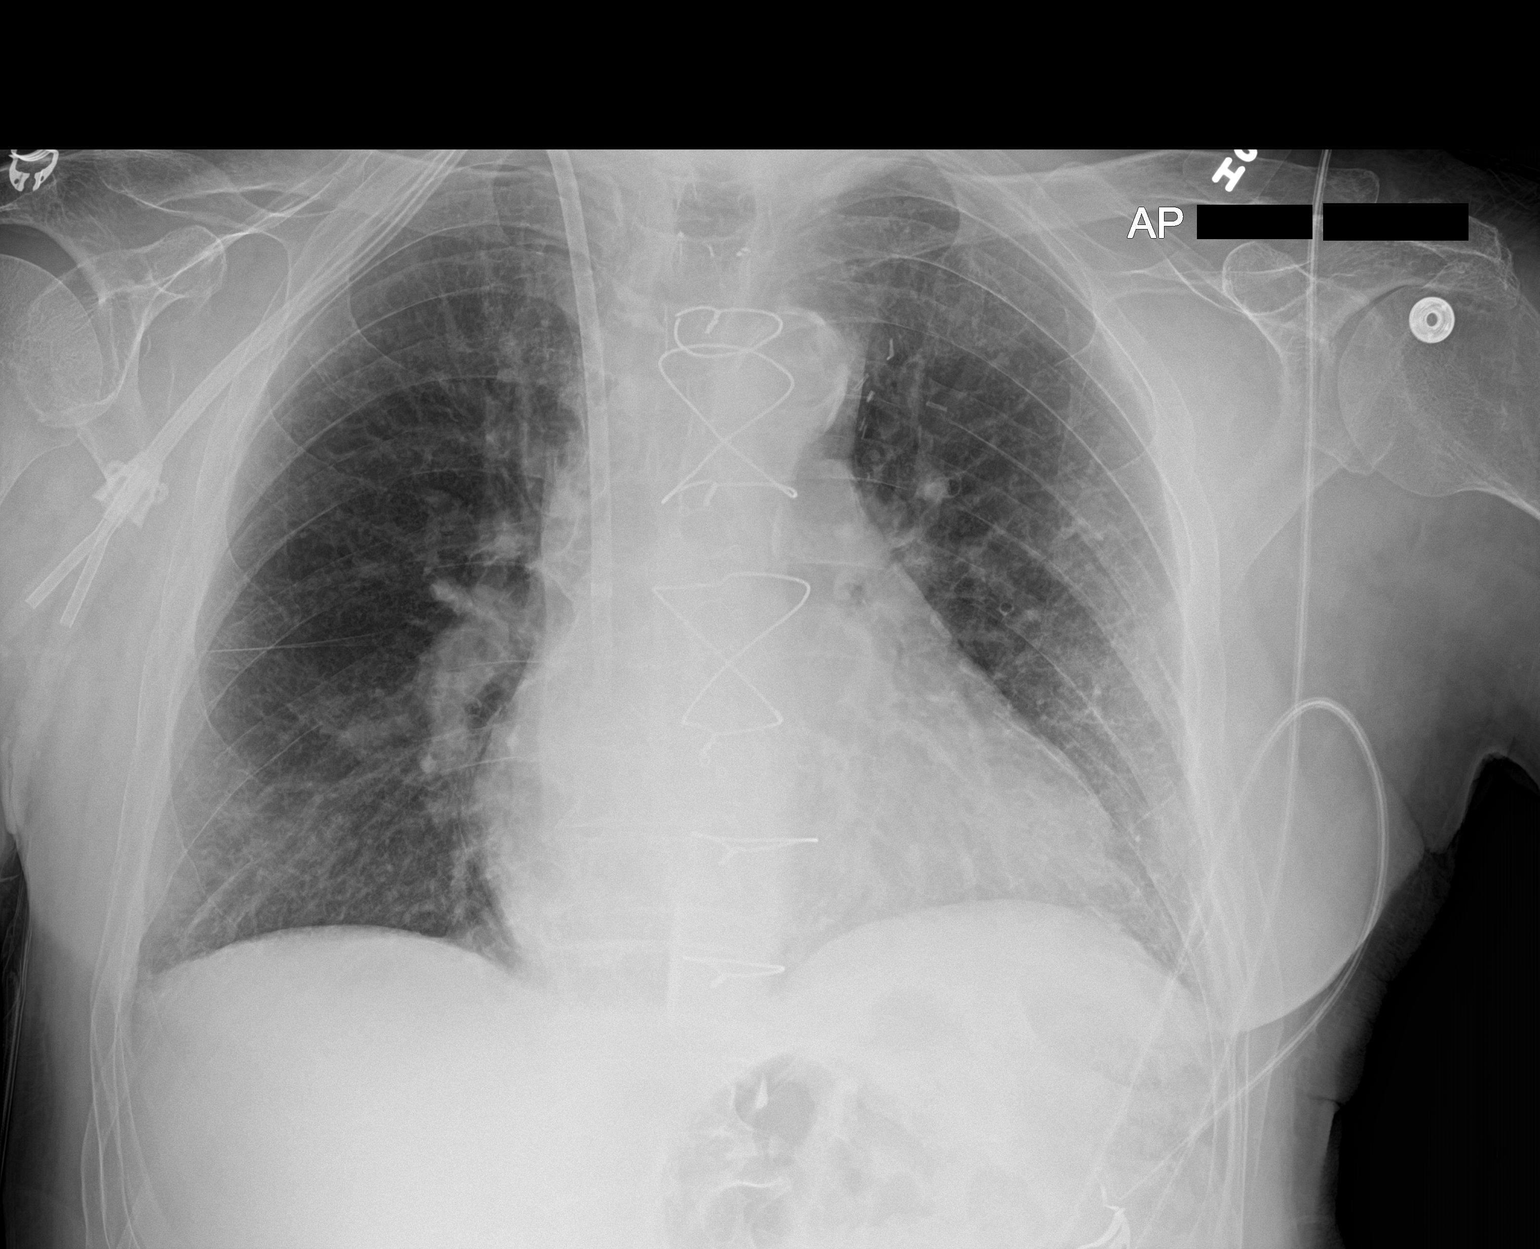

[1 of 1 positions shown; findings below may reference images not displayed]

FINDINGS: Stable cardiomegaly. Atherosclerosis of abdominal aorta is noted.
Sternotomy wires are noted. Right internal jugular dialysis catheter
is noted with distal tip in expected position of cavoatrial
junction. No pneumothorax or pleural effusion is noted. No acute
pulmonary disease is noted. Bony thorax is unremarkable.
IMPRESSION: Right internal jugular dialysis catheter is noted with distal tip in
expected position of cavoatrial junction. No pneumothorax is noted.

Aortic Atherosclerosis ([id number].M).

## 2019-09-15 DIAGNOSIS — J309 Allergic rhinitis, unspecified: Secondary | ICD-10-CM | POA: Insufficient documentation

## 2019-09-15 DIAGNOSIS — Z86010 Personal history of colonic polyps: Secondary | ICD-10-CM | POA: Insufficient documentation

## 2019-09-15 DIAGNOSIS — M869 Osteomyelitis, unspecified: Secondary | ICD-10-CM | POA: Insufficient documentation

## 2019-09-15 DIAGNOSIS — C61 Malignant neoplasm of prostate: Secondary | ICD-10-CM | POA: Insufficient documentation

## 2019-09-15 DIAGNOSIS — H4310 Vitreous hemorrhage, unspecified eye: Secondary | ICD-10-CM | POA: Insufficient documentation

## 2019-09-15 DIAGNOSIS — N185 Chronic kidney disease, stage 5: Secondary | ICD-10-CM | POA: Insufficient documentation

## 2019-09-15 DIAGNOSIS — A0472 Enterocolitis due to Clostridium difficile, not specified as recurrent: Secondary | ICD-10-CM | POA: Insufficient documentation

## 2019-09-15 DIAGNOSIS — E1139 Type 2 diabetes mellitus with other diabetic ophthalmic complication: Secondary | ICD-10-CM | POA: Insufficient documentation

## 2019-09-21 ENCOUNTER — Ambulatory Visit (INDEPENDENT_AMBULATORY_CARE_PROVIDER_SITE_OTHER): Payer: Medicare Other

## 2019-09-21 ENCOUNTER — Other Ambulatory Visit: Payer: Self-pay

## 2019-09-21 ENCOUNTER — Ambulatory Visit (INDEPENDENT_AMBULATORY_CARE_PROVIDER_SITE_OTHER): Payer: Medicare Other | Admitting: Podiatry

## 2019-09-21 ENCOUNTER — Other Ambulatory Visit: Payer: Self-pay | Admitting: Podiatry

## 2019-09-21 DIAGNOSIS — S82842G Displaced bimalleolar fracture of left lower leg, subsequent encounter for closed fracture with delayed healing: Secondary | ICD-10-CM

## 2019-09-21 DIAGNOSIS — M25572 Pain in left ankle and joints of left foot: Secondary | ICD-10-CM

## 2019-09-21 DIAGNOSIS — S82842D Displaced bimalleolar fracture of left lower leg, subsequent encounter for closed fracture with routine healing: Secondary | ICD-10-CM | POA: Diagnosis not present

## 2019-09-21 DIAGNOSIS — R600 Localized edema: Secondary | ICD-10-CM

## 2019-09-21 DIAGNOSIS — M14672 Charcot's joint, left ankle and foot: Secondary | ICD-10-CM

## 2019-09-21 NOTE — Progress Notes (Signed)
  Subjective:  Patient ID: John Ortiz, male    DOB: [birth year],  MRN: [id number]  Chief Complaint  Patient presents with  . Routine Post Op    POV Pt. states," bottm heel and both sides of leg is very sore.' -pt states he is very fustrated with foot issue and now is taking counceling to overcome his foot issue since it's been 6 mo    DOS: 04/27/2019 Procedure: Left Ankle ORIF, application of External fixator  DOS: 07/27/2019  Procedure: Left ankle removal of fixator  74 y.o. male presents with the above complaint. History confirmed with patient.  He states he has noticed he has been walking differently in the past week or so and his wife is noticed that it well.  He thinks his foot has looked different.  Objective:  Left foot with edema, medial ankle with edema, no erythema today.  Decreased ankle ROM but compensatory hindfoot ROM with good DF/PF.   Assessment:   1. Closed displaced bimalleolar fracture of left ankle with delayed healing   2. Charcot ankle, left   3. Closed displaced bimalleolar fracture of left lower leg with routine healing   4. Localized edema     Plan:  Patient was evaluated and treated and all questions answered.  Post-operative State -Improved edema but still present today.  Worsening hindfoot positioning noted on x-rays today with medial malleolus fracture. -No open ulcerations or risk of pending ulceration -Worsening Charcot process, await quiescence before discussing other surgery.  Discussed minimal weightbearing with the boot only. -Had lengthy conversation with patient about possible next steps.  We discussed elective amputation but he is not interested in this, he said that is not something he wants to consider.  We did discuss performing tibiotalar calcaneal arthrodesis with rod however await questions of new Charcot process prior to consideration.  We discussed his poor vasculature and that this would be a high risk procedure we will consider  performing vascular studies prior to any other surgery. -Unna boot was reapplied today for reduction of edema and for additional stabilization -We did discuss getting him counseling and I offered however we could be of assistance to obtain this.  He is currently working for this at the [place].  He is getting very frustrated as lack of progress.  He is being very hard on himself not listening and causing worsening of the original injury.  I think he would benefit from counseling.  Return in about 2 weeks (around 10/05/2019).

## 2019-09-22 ENCOUNTER — Other Ambulatory Visit: Payer: Self-pay | Admitting: Podiatry

## 2019-09-22 DIAGNOSIS — Z9889 Other specified postprocedural states: Secondary | ICD-10-CM

## 2019-09-29 ENCOUNTER — Telehealth: Payer: Self-pay

## 2019-09-29 NOTE — Telephone Encounter (Signed)
Called and spoke to Pt's wife John Ortiz. She described worsening edema, pain, color changes but no frank constant black or blue discoloration to the foot. States the nurse came yesterday and could not palpate a pulse. This could be an acute vascular event or it could be due to edema. Either way recommended he present to the ER for vascular eval - r/o blood clot or CLI.

## 2019-09-29 NOTE — Telephone Encounter (Signed)
Pt's wife called stating the pt does not have a pulse on his sx foot, and there is an increase of swelling. Wife states pt's feet have been very cold, discolored (black/blue), and with constant pain. Please advice

## 2019-09-30 ENCOUNTER — Telehealth: Payer: Self-pay

## 2019-09-30 ENCOUNTER — Telehealth: Payer: Self-pay | Admitting: *Deleted

## 2019-09-30 NOTE — Telephone Encounter (Signed)
Melissa from [facility] called to get verbal orders for the patient and Per Dr March Rummage can use web roll and ace bandages for 2 weeks 2 times a week instead of the unna boots and to hold off on the other types of  Compressions. Lattie Haw

## 2019-09-30 NOTE — Telephone Encounter (Signed)
Pt called stating he was discharged from the hospital yesterday, pt states there was no blood clot found, but they found calcification of the veins on  his Left foot with circulation cut down in his foot. Pt. States he is feeling much better and his Rt foot is doing fine.

## 2019-09-30 NOTE — Telephone Encounter (Signed)
Good to hear - thank you

## 2019-10-05 ENCOUNTER — Other Ambulatory Visit: Payer: Self-pay

## 2019-10-05 ENCOUNTER — Ambulatory Visit (INDEPENDENT_AMBULATORY_CARE_PROVIDER_SITE_OTHER): Payer: Medicare Other | Admitting: Podiatry

## 2019-10-05 DIAGNOSIS — M14672 Charcot's joint, left ankle and foot: Secondary | ICD-10-CM

## 2019-10-05 DIAGNOSIS — S82842D Displaced bimalleolar fracture of left lower leg, subsequent encounter for closed fracture with routine healing: Secondary | ICD-10-CM

## 2019-10-05 DIAGNOSIS — S82842G Displaced bimalleolar fracture of left lower leg, subsequent encounter for closed fracture with delayed healing: Secondary | ICD-10-CM

## 2019-10-12 ENCOUNTER — Encounter: Payer: Medicare Other | Admitting: Podiatry

## 2019-10-14 ENCOUNTER — Encounter: Payer: Medicare Other | Admitting: Sports Medicine

## 2019-10-15 ENCOUNTER — Ambulatory Visit (INDEPENDENT_AMBULATORY_CARE_PROVIDER_SITE_OTHER): Payer: Medicare Other | Admitting: Sports Medicine

## 2019-10-15 ENCOUNTER — Other Ambulatory Visit: Payer: Self-pay

## 2019-10-15 ENCOUNTER — Encounter: Payer: Self-pay | Admitting: Sports Medicine

## 2019-10-15 DIAGNOSIS — S82842D Displaced bimalleolar fracture of left lower leg, subsequent encounter for closed fracture with routine healing: Secondary | ICD-10-CM

## 2019-10-15 DIAGNOSIS — E0851 Diabetes mellitus due to underlying condition with diabetic peripheral angiopathy without gangrene: Secondary | ICD-10-CM

## 2019-10-15 DIAGNOSIS — Z9889 Other specified postprocedural states: Secondary | ICD-10-CM

## 2019-10-15 DIAGNOSIS — R600 Localized edema: Secondary | ICD-10-CM

## 2019-10-15 DIAGNOSIS — Z992 Dependence on renal dialysis: Secondary | ICD-10-CM

## 2019-10-15 DIAGNOSIS — N186 End stage renal disease: Secondary | ICD-10-CM

## 2019-10-15 DIAGNOSIS — Z794 Long term (current) use of insulin: Secondary | ICD-10-CM

## 2019-10-15 DIAGNOSIS — M14672 Charcot's joint, left ankle and foot: Secondary | ICD-10-CM

## 2019-10-15 MED ORDER — OXYCODONE-ACETAMINOPHEN 5-325 MG PO TABS: 1.0000 | ORAL_TABLET | ORAL | 0 refills | Status: AC | PRN

## 2019-10-15 NOTE — Progress Notes (Signed)
Subjective: John Ortiz is a 74 y.o. male patient seen today in office for POV, DOS 07/27/2019, S/P removal of external fixator, Dr. March Rummage. Reports that his leg looks better but his foot is still very swollen with pain 4-5/10 in the foot and continues to use the boot to help when he weightbears a little and using his wheelchair, reports that he is frustrated with how long it has taken to heal,  Denies calf pain, denies headache, chest pain, shortness of breath, nausea, vomiting, fever, or chills. Reports that he needs a refill on his pain medication. No other issues noted.   Patient Active Problem List   Diagnosis Date Noted  . Allergic rhinitis 09/15/2019  . Clostridium enterocolitis 09/15/2019  . 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 History of colonic polyps 09/15/2019  . Malignant neoplasm of prostate ([place]) 09/15/2019  . Osteomyelitis ([place]) 09/15/2019  . Stage 5 chronic kidney disease ([place]) 09/15/2019  . Vitreous hemorrhage ([place]) 09/15/2019  . Bell's palsy 08/23/2019  . Headache, unspecified 08/16/2019  . Celiac disease 07/14/2019  . Former smoker 05/25/2019  . Long-term use of aspirin therapy 05/24/2019  . Mixed hyperlipidemia 05/24/2019  . Sprain of right ankle 12/21/2018  . 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 Folliculitis 06/11/5101  . Diabetic ulcer of right foot associated with diabetes mellitus due to underlying condition ([place]) 07/09/2018  . Follow up 07/09/2018  . Anemia in chronic kidney disease, on chronic dialysis ([place]) 06/04/2018  . CHF (congestive heart failure) ([place]) 06/04/2018  . Shortness of breath 06/04/2018  . 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 Atrial fibrillation and flutter ([place]) 04/22/2018  . Coronary artery disease involving coronary bypass graft of  native heart without angina pectoris 04/22/2018  . Aortic stenosis, moderate 04/22/2018  . Hx of CABG 04/22/2018  . S/P AVR (aortic valve replacement) 04/22/2018  . ESRD (end stage renal disease) on dialysis ([place]) 04/22/2018  . PAD (peripheral artery disease) ([place]) 04/14/2018  . Critical lower limb ischemia 04/14/2018  . Hypercalcemia 10/24/2017  . Fluid overload, unspecified 10/22/2017  . Coagulation defect, unspecified ([place]) 08/01/2017  . Diarrhea, unspecified 08/01/2017  . Pain, unspecified 08/01/2017  . Pruritus, unspecified 08/01/2017  . Dependence on renal dialysis ([place]) 07/30/2017  . Iron deficiency anemia, unspecified 07/30/2017  . Long term (current) use of insulin ([place]) 07/30/2017  . Renal osteodystrophy 07/30/2017  . Secondary hyperparathyroidism of renal origin ([place]) 07/30/2017  . Unspecified protein-calorie malnutrition ([place]) 07/30/2017  . Abscess of right groin 04/28/2015  . Coronary artery disease involving native coronary artery of native heart without angina pectoris 12/21/2014  . Chronic diastolic heart failure ([place]) 06/02/2014  . DKA (diabetic ketoacidoses) ([place]) 05/14/2014  . 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 Fracture of tibia 04/07/2014  . Closed left ankle fracture 01/31/2014  . Tibial plateau fracture, left, closed, initial encounter 01/23/2014  . Cellulitis 04/26/2013  . Cerumen impaction 09/18/2012  . Contusion 02/12/2012  . Fall 02/12/2012  . Dyslipidemia 07/29/2011  . Edema 07/29/2011  . Essential hypertension 07/29/2011    Current Outpatient Medications on File Prior to Visit  Medication Sig Dispense Refill  . acetaminophen (TYLENOL) 500 MG tablet Take 1,000 mg by mouth at bedtime as needed for moderate pain.    Marland Kitchen aspirin EC 81 MG tablet Take 81 mg by mouth at bedtime.     Marland Kitchen B  Complex-C-Folic Acid (DIALYVITE 944) 0.8 MG TABS Take 1 tablet by mouth daily with lunch.     . calcium acetate (PHOSLO) 667 MG capsule Take 2,001 mg by mouth 3 (three) times  daily with meals.    . Cinacalcet HCl (SENSIPAR PO) Take by mouth.    . clindamycin (CLEOCIN) 300 MG capsule Take 1 capsule (300 mg total) by mouth 2 (two) times daily. 14 capsule 0  . clopidogrel (PLAVIX) 75 MG tablet Take by mouth.    Marland Kitchen FLUoxetine (PROZAC) 20 MG capsule Take 20 mg by mouth daily.    . furosemide (LASIX) 80 MG tablet Take 80 mg by mouth 2 (two) times daily.    Marland Kitchen glucagon 1 MG injection Inject 1 mL as directed once as needed for up to 1 dose.    Marland Kitchen glucosamine-chondroitin 500-400 MG tablet Take by mouth.    Marland Kitchen glucose 4 GM chewable tablet Chew 1-5 tablets by mouth as needed for low blood sugar.    Marland Kitchen glucose blood (PRECISION XTRA TEST STRIPS) test strip Use one strip to test blood sugar as directed    . insulin aspart (NOVOLOG FLEXPEN) 100 UNIT/ML FlexPen Inject 7-15 Units into the skin 3 (three) times daily with meals. Sliding Scale Insulin    . insulin glargine (LANTUS) 100 UNIT/ML injection Inject 8 Units into the skin 2 (two) times daily.     Marland Kitchen lidocaine (LMX) 4 % cream Apply topically.    . Lidocaine HCl 4 % CREA Apply topically.    . Methoxy PEG-Epoetin Beta (MIRCERA IJ) Mircera    . nitroGLYCERIN (NITROSTAT) 0.4 MG SL tablet Place under the tongue.    Marland Kitchen oxyCODONE (ROXICODONE) 5 MG immediate release tablet Take 1 tablet (5 mg total) by mouth every 6 (six) hours as needed. 12 tablet 0  . predniSONE (DELTASONE) 50 MG tablet TAKE 1 TABLET BY MOUTH ONCE DAILY    . silver sulfADIAZINE (SILVADENE) 1 % cream Apply pea-sized amount to wound daily. (Patient taking differently: Apply 1 application topically daily. Apply pea-sized amount to wound daily.) 50 g 0  . simvastatin (ZOCOR) 40 MG tablet Take 40 mg by mouth at bedtime.    . Skin Protectants, Misc. (EUCERIN) cream Apply topically.    . TechLite Lancets MISC 1 each by Misc.(Non-Drug; Combo Route) route daily.    . traMADol (ULTRAM) 50 MG tablet Take by mouth.    . vitamin B-12 (CYANOCOBALAMIN) 500 MCG tablet Take 500 mcg by  mouth daily.     No current facility-administered medications on file prior to visit.    Allergies  Allergen Reactions  . Promethazine Other (See Comments)    Tremors, cold, restlessness.  . Povidone-Iodine Rash    Objective: There were no vitals filed for this visit.  General: No acute distress, AAOx3  Left Foot and ankle:Surgical sites well healed, capillary fill time intact all remaining digits of the left foot there is mild pain with palpation to achilles and plantar fascia insertion on the left with Charcot deformity and swelling at left,  No significant redness warmth or signs of DVT or any signs of acute infection noted to the left foot and ankle.  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, with long-term current use of insulin (HCC)     Genitourinary   ESRD (end stage renal disease) on dialysis ([place])     Other  Edema    Other Visit Diagnoses    Charcot ankle, left    -  Primary   Closed displaced bimalleolar fracture of left lower leg with routine healing       Relevant Medications   oxyCODONE-acetaminophen (PERCOCET) 5-325 MG tablet   Post-operative state           -Patient seen and evaluated -Applied Surgitube compression sleeve to knee on left and advised that home nurse can re-evaluate him to re-apply unna boot if needed -Advised patient to rest and to continue with elevation and continue to minimally weight-bear and use wheelchair for longer distances -Refilled percocet for pain  -Advised patient again to discuss long term care with Dr. March Rummage -Will plan for follow up with Dr. March Rummage for continued care at next office visit. In the meantime, patient to call office if any issues or problems arise.   Landis Martins, DPM

## 2019-10-19 ENCOUNTER — Encounter: Payer: Medicare Other | Admitting: Podiatry

## 2019-10-24 NOTE — Progress Notes (Signed)
  Subjective:  Patient ID: John Ortiz, male    DOB: 08-17-1946,  MRN: [id number]  Chief Complaint  Patient presents with  . Routine Post Op    Pt. states," the only par that hurts is the back of my heel and the whold bottom of my foot; 7/10 shapr occasional pain." -w/ ussual swellign Tx: ace wrap, boot and oxy (pRN )  . Diabetes    FBS:L 213 A1c: 6.5   DOS: 04/27/2019 Procedure: Left Ankle ORIF, application of External fixator  DOS: 07/27/2019  Procedure: Left ankle removal of fixator  74 y.o. male presents with the above complaint. History confirmed with patient.  States he went to the hospital and was told calcification in his arteries but no blood clots.  Objective:  Left foot with edema, medial ankle with edema, no erythema today.  Decreased ankle ROM but compensatory hindfoot ROM with good DF/PF.  Assessment:   1. Closed displaced bimalleolar fracture of left ankle with delayed healing   2. Charcot ankle, left   3. Closed displaced bimalleolar fracture of left lower leg with routine healing     Plan:  Patient was evaluated and treated and all questions answered.  Post-operative State  -No open ulcerations or risk of pending ulceration -Again discussed the patient that he likely will need further surgery however he is at high risk given the arterial issues and current Charcot process.  Will await questions -Unna boot reapplied -He has not gotten counseling but he is waiting to hear on this.  I did take time to listen to his concerns and stresses -We did discuss at some point he may need vascular intervention

## 2019-11-02 ENCOUNTER — Other Ambulatory Visit: Payer: Self-pay

## 2019-11-02 ENCOUNTER — Ambulatory Visit (INDEPENDENT_AMBULATORY_CARE_PROVIDER_SITE_OTHER): Payer: Medicare Other

## 2019-11-02 ENCOUNTER — Ambulatory Visit (INDEPENDENT_AMBULATORY_CARE_PROVIDER_SITE_OTHER): Payer: Medicare Other | Admitting: Podiatry

## 2019-11-02 DIAGNOSIS — S82842S Displaced bimalleolar fracture of left lower leg, sequela: Secondary | ICD-10-CM | POA: Diagnosis not present

## 2019-11-02 DIAGNOSIS — Z9889 Other specified postprocedural states: Secondary | ICD-10-CM

## 2019-11-02 DIAGNOSIS — R600 Localized edema: Secondary | ICD-10-CM

## 2019-11-02 DIAGNOSIS — M14672 Charcot's joint, left ankle and foot: Secondary | ICD-10-CM | POA: Diagnosis not present

## 2019-11-02 NOTE — Progress Notes (Signed)
  Subjective:  Patient ID: John Ortiz, male    DOB: Oct 26, 1945,  MRN: [id number]  No chief complaint on file.  DOS: 04/27/2019 Procedure: Left Ankle ORIF, application of External fixator  DOS: 07/27/2019  Procedure: Left ankle removal of fixator  74 y.o. male presents with the above complaint. States his leg is a little sore here and there, has been trying to exercise a little. Reports soreness at the back of the left leg, and the bottom of the foot. He does not have any pain in the ankle joint itself. Objective:  Left foot with edema, reducible erythema. Warmth on exam. Anterior bulge. Ankle dorsiflexes and plantarflexes. Varus deformity noted.  Radiographs: worsening ankle collapse. Talar collapse noted. Osseous destruction noted. Varus ankle deformity noted. Vascular calcifications noted.  Assessment:   1. Closed displaced bimalleolar fracture of left ankle, sequela   2. Charcot ankle, left   3. Localized edema   4. Post-operative state     Plan:  Patient was evaluated and treated and all questions answered.  Charcot left ankle -Worsening noted on XR today. Talar collapse noted. -Order blood work to r/o infectious process. He continues to show no signs of local infection. -Discussed BKA vs continued effort at salvage. Patient would like to proceed with attempt at salvage. -Soft cast of unna boot and  -Will place referral to Dr. Loletta Specter for evaluation.

## 2019-11-03 ENCOUNTER — Telehealth: Payer: Self-pay | Admitting: *Deleted

## 2019-11-03 NOTE — Telephone Encounter (Signed)
Dr. March Rummage states he spoke with Dr. Kathie Dike and pt has been referred.

## 2019-11-03 NOTE — Telephone Encounter (Signed)
-----   Message from Evelina Bucy, DPM sent at 11/02/2019  4:06 PM EST ----- Can we refer to Dr. Loletta Specter at [facility]?

## 2019-11-04 LAB — CBC WITH DIFFERENTIAL/PLATELET
Basophils Absolute: 0.1 10*3/uL (ref 0.0–0.2)
Basos: 2 %
EOS (ABSOLUTE): 0.3 10*3/uL (ref 0.0–0.4)
Eos: 5 %
Hematocrit: 36.7 % — ABNORMAL LOW (ref 37.5–51.0)
Hemoglobin: 12.1 g/dL — ABNORMAL LOW (ref 13.0–17.7)
Immature Grans (Abs): 0 10*3/uL (ref 0.0–0.1)
Immature Granulocytes: 1 %
Lymphocytes Absolute: 0.6 10*3/uL — ABNORMAL LOW (ref 0.7–3.1)
Lymphs: 12 %
MCH: 32.2 pg (ref 26.6–33.0)
MCHC: 33 g/dL (ref 31.5–35.7)
MCV: 98 fL — ABNORMAL HIGH (ref 79–97)
Monocytes Absolute: 0.6 10*3/uL (ref 0.1–0.9)
Monocytes: 12 %
Neutrophils Absolute: 3.7 10*3/uL (ref 1.4–7.0)
Neutrophils: 68 %
Platelets: 209 10*3/uL (ref 150–450)
RBC: 3.76 x10E6/uL — ABNORMAL LOW (ref 4.14–5.80)
RDW: 14.5 % (ref 11.6–15.4)
WBC: 5.3 10*3/uL (ref 3.4–10.8)

## 2019-11-04 LAB — SEDIMENTATION RATE: Sed Rate: 10 mm/hr (ref 0–30)

## 2019-11-04 LAB — C-REACTIVE PROTEIN: CRP: 39 mg/L — ABNORMAL HIGH (ref 0–10)

## 2019-11-16 ENCOUNTER — Encounter: Payer: Medicare Other | Admitting: Podiatry

## 2019-12-30 ENCOUNTER — Telehealth: Payer: Self-pay

## 2019-12-30 NOTE — Telephone Encounter (Signed)
Pt called and LVM asking if his shoes has came in yet or if he needs to get new measurements again.

## 2020-03-05 NOTE — Progress Notes (Signed)
Subjective:  Patient ID: John Ortiz, male    DOB: July 03, 1946,  MRN: [id number]  No chief complaint on file.   DOS: 04/27/2019 Procedure: Left Ankle ORIF, application of External fixator  74 y.o. male returns for post-op check.  States he had pain this morning at 3 AM where he is missing his great toe still is only present for a few seconds and then went away.  Review of Systems: Negative except as noted in the HPI. Denies N/V/F/Ch.  Past Medical History:  Diagnosis Date  . Anemia in CKD (chronic kidney disease)   . Aneurysm (HCC)    Left arm  . Arthritis   . CHF (congestive heart failure) ([place])   . Chronic kidney disease, stage V ([place])    MWF- [place]  . Coagulation defect ([place])   . Coronary artery disease   . Diabetes mellitus type 2, uncomplicated ([place])   . Diarrhea   . Fluid overload   . GERD (gastroesophageal reflux disease)   . Heart murmur   . Hypercalcemia   . Iron deficiency anemia, unspecified   . Non-healing ulcer of foot ([place])    Right foot  . Pain   . Pruritus   . Renal osteodystrophy   . Secondary hyperparathyroidism of renal origin ([place])   . Shortness of breath   . Type 2 diabetes mellitus with hypoglycemia without coma ([place])   . Unspecified protein-calorie malnutrition ([place])     Current Outpatient Medications:  .  acetaminophen (TYLENOL) 500 MG tablet, Take 1,000 mg by mouth at bedtime as needed for moderate pain., Disp: , Rfl:  .  aspirin EC 81 MG tablet, Take 81 mg by mouth at bedtime. , Disp: , Rfl:  .  B Complex-C-Folic Acid (DIALYVITE 024) 0.8 MG TABS, Take 1 tablet by mouth daily with lunch. , Disp: , Rfl:  .  calcium acetate (PHOSLO) 667 MG capsule, Take 2,001 mg by mouth 3 (three) times daily with meals., Disp: , Rfl:  .  Cinacalcet HCl (SENSIPAR PO), Take by mouth., Disp: , Rfl:  .  clindamycin (CLEOCIN) 300 MG capsule, Take 1 capsule (300 mg total) by mouth 2 (two) times daily., Disp: 14 capsule, Rfl: 0 .  clopidogrel (PLAVIX) 75 MG  tablet, Take by mouth., Disp: , Rfl:  .  FLUoxetine (PROZAC) 20 MG capsule, Take 20 mg by mouth daily., Disp: , Rfl:  .  furosemide (LASIX) 80 MG tablet, Take 80 mg by mouth 2 (two) times daily., Disp: , Rfl:  .  glucagon 1 MG injection, Inject 1 mL as directed once as needed for up to 1 dose., Disp: , Rfl:  .  glucosamine-chondroitin 500-400 MG tablet, Take by mouth., Disp: , Rfl:  .  glucose 4 GM chewable tablet, Chew 1-5 tablets by mouth as needed for low blood sugar., Disp: , Rfl:  .  glucose blood (PRECISION XTRA TEST STRIPS) test strip, Use one strip to test blood sugar as directed, Disp: , Rfl:  .  insulin aspart (NOVOLOG FLEXPEN) 100 UNIT/ML FlexPen, Inject 7-15 Units into the skin 3 (three) times daily with meals. Sliding Scale Insulin, Disp: , Rfl:  .  insulin glargine (LANTUS) 100 UNIT/ML injection, Inject 8 Units into the skin 2 (two) times daily. , Disp: , Rfl:  .  lidocaine (LMX) 4 % cream, Apply topically., Disp: , Rfl:  .  Lidocaine HCl 4 % CREA, Apply topically., Disp: , Rfl:  .  Methoxy PEG-Epoetin Beta (MIRCERA IJ), Mircera, Disp: , Rfl:  .  nitroGLYCERIN (NITROSTAT) 0.4 MG SL tablet, Place under the tongue., Disp: , Rfl:  .  oxyCODONE (ROXICODONE) 5 MG immediate release tablet, Take 1 tablet (5 mg total) by mouth every 6 (six) hours as needed., Disp: 12 tablet, Rfl: 0 .  oxyCODONE-acetaminophen (PERCOCET) 5-325 MG tablet, Take 1 tablet by mouth every 4 (four) hours as needed for severe pain., Disp: 20 tablet, Rfl: 0 .  predniSONE (DELTASONE) 50 MG tablet, TAKE 1 TABLET BY MOUTH ONCE DAILY, Disp: , Rfl:  .  silver sulfADIAZINE (SILVADENE) 1 % cream, Apply pea-sized amount to wound daily. (Patient taking differently: Apply 1 application topically daily. Apply pea-sized amount to wound daily.), Disp: 50 g, Rfl: 0 .  simvastatin (ZOCOR) 40 MG tablet, Take 40 mg by mouth at bedtime., Disp: , Rfl:  .  Skin Protectants, Misc. (EUCERIN) cream, Apply topically., Disp: , Rfl:  .   TechLite Lancets MISC, 1 each by Misc.(Non-Drug; Combo Route) route daily., Disp: , Rfl:  .  traMADol (ULTRAM) 50 MG tablet, Take by mouth., Disp: , Rfl:  .  vitamin B-12 (CYANOCOBALAMIN) 500 MCG tablet, Take 500 mcg by mouth daily., Disp: , Rfl:   Social History   Tobacco Use  Smoking Status Former Smoker  . Years: 15.00  . Quit date: 01/24/1989  . Years since quitting: 31.1  Smokeless Tobacco Never Used    Allergies  Allergen Reactions  . Promethazine Other (See Comments)    Tremors, cold, restlessness.  . Povidone-Iodine Rash   Objective:  There were no vitals filed for this visit. There is no height or weight on file to calculate BMI. Constitutional Well developed. Well nourished.  Vascular Foot warm and well perfused. Capillary refill normal to all digits.   Neurologic Normal speech. Oriented to person, place, and time. Epicritic sensation to light touch grossly present bilaterally.  Dermatologic  incision with overlying eschar no signs of infection.  Pin site healing well without signs of infection no warmth no erythema no purulence  Orthopedic: No tenderness palpation about the surgical sites.  Ankle position appears anatomic   Radiographs: None Assessment:   1. Post-operative state    Plan:  Patient was evaluated and treated and all questions answered.  S/p foot surgery left -Progressing as expected post-operatively. -XR: None -WB Status: minimal weightbearing (for transfers only) -Sutures: intact. -Medications: none refilled. -Foot redressed.  Adaptic and 4 x 4 applied around each pin site and incision.  Kerlix and Ace bandage applied  No follow-ups on file.

## 2020-03-08 NOTE — Progress Notes (Signed)
Subjective:  Patient ID: John Ortiz, male    DOB: Jan 15, 1946,  MRN: [id number]  No chief complaint on file.   DOS: 04/27/2019 Procedure: Left Ankle ORIF, application of External fixator  74 y.o. male returns for post-op check.  History above confirmed with patient.  Review of Systems: Negative except as noted in the HPI. Denies N/V/F/Ch.  Past Medical History:  Diagnosis Date  . Anemia in CKD (chronic kidney disease)   . Aneurysm (HCC)    Left arm  . Arthritis   . CHF (congestive heart failure) ([place])   . Chronic kidney disease, stage V ([place])    MWF- [place]  . Coagulation defect ([place])   . Coronary artery disease   . Diabetes mellitus type 2, uncomplicated ([place])   . Diarrhea   . Fluid overload   . GERD (gastroesophageal reflux disease)   . Heart murmur   . Hypercalcemia   . Iron deficiency anemia, unspecified   . Non-healing ulcer of foot ([place])    Right foot  . Pain   . Pruritus   . Renal osteodystrophy   . Secondary hyperparathyroidism of renal origin ([place])   . Shortness of breath   . Type 2 diabetes mellitus with hypoglycemia without coma ([place])   . Unspecified protein-calorie malnutrition ([place])     Current Outpatient Medications:  .  acetaminophen (TYLENOL) 500 MG tablet, Take 1,000 mg by mouth at bedtime as needed for moderate pain., Disp: , Rfl:  .  aspirin EC 81 MG tablet, Take 81 mg by mouth at bedtime. , Disp: , Rfl:  .  B Complex-C-Folic Acid (DIALYVITE 578) 0.8 MG TABS, Take 1 tablet by mouth daily with lunch. , Disp: , Rfl:  .  calcium acetate (PHOSLO) 667 MG capsule, Take 2,001 mg by mouth 3 (three) times daily with meals., Disp: , Rfl:  .  Cinacalcet HCl (SENSIPAR PO), Take by mouth., Disp: , Rfl:  .  clindamycin (CLEOCIN) 300 MG capsule, Take 1 capsule (300 mg total) by mouth 2 (two) times daily., Disp: 14 capsule, Rfl: 0 .  clopidogrel (PLAVIX) 75 MG tablet, Take by mouth., Disp: , Rfl:  .  FLUoxetine (PROZAC) 20 MG capsule, Take 20 mg by mouth  daily., Disp: , Rfl:  .  furosemide (LASIX) 80 MG tablet, Take 80 mg by mouth 2 (two) times daily., Disp: , Rfl:  .  glucagon 1 MG injection, Inject 1 mL as directed once as needed for up to 1 dose., Disp: , Rfl:  .  glucosamine-chondroitin 500-400 MG tablet, Take by mouth., Disp: , Rfl:  .  glucose 4 GM chewable tablet, Chew 1-5 tablets by mouth as needed for low blood sugar., Disp: , Rfl:  .  glucose blood (PRECISION XTRA TEST STRIPS) test strip, Use one strip to test blood sugar as directed, Disp: , Rfl:  .  insulin aspart (NOVOLOG FLEXPEN) 100 UNIT/ML FlexPen, Inject 7-15 Units into the skin 3 (three) times daily with meals. Sliding Scale Insulin, Disp: , Rfl:  .  insulin glargine (LANTUS) 100 UNIT/ML injection, Inject 8 Units into the skin 2 (two) times daily. , Disp: , Rfl:  .  lidocaine (LMX) 4 % cream, Apply topically., Disp: , Rfl:  .  Lidocaine HCl 4 % CREA, Apply topically., Disp: , Rfl:  .  Methoxy PEG-Epoetin Beta (MIRCERA IJ), Mircera, Disp: , Rfl:  .  nitroGLYCERIN (NITROSTAT) 0.4 MG SL tablet, Place under the tongue., Disp: , Rfl:  .  oxyCODONE (ROXICODONE) 5 MG immediate  release tablet, Take 1 tablet (5 mg total) by mouth every 6 (six) hours as needed., Disp: 12 tablet, Rfl: 0 .  oxyCODONE-acetaminophen (PERCOCET) 5-325 MG tablet, Take 1 tablet by mouth every 4 (four) hours as needed for severe pain., Disp: 20 tablet, Rfl: 0 .  predniSONE (DELTASONE) 50 MG tablet, TAKE 1 TABLET BY MOUTH ONCE DAILY, Disp: , Rfl:  .  silver sulfADIAZINE (SILVADENE) 1 % cream, Apply pea-sized amount to wound daily. (Patient taking differently: Apply 1 application topically daily. Apply pea-sized amount to wound daily.), Disp: 50 g, Rfl: 0 .  simvastatin (ZOCOR) 40 MG tablet, Take 40 mg by mouth at bedtime., Disp: , Rfl:  .  Skin Protectants, Misc. (EUCERIN) cream, Apply topically., Disp: , Rfl:  .  TechLite Lancets MISC, 1 each by Misc.(Non-Drug; Combo Route) route daily., Disp: , Rfl:  .  traMADol  (ULTRAM) 50 MG tablet, Take by mouth., Disp: , Rfl:  .  vitamin B-12 (CYANOCOBALAMIN) 500 MCG tablet, Take 500 mcg by mouth daily., Disp: , Rfl:   Social History   Tobacco Use  Smoking Status Former Smoker  . Years: 15.00  . Quit date: 01/24/1989  . Years since quitting: 31.1  Smokeless Tobacco Never Used    Allergies  Allergen Reactions  . Promethazine Other (See Comments)    Tremors, cold, restlessness.  . Povidone-Iodine Rash   Objective:  There were no vitals filed for this visit. There is no height or weight on file to calculate BMI. Constitutional Well developed. Well nourished.  Vascular Foot warm and well perfused. Capillary refill normal to all digits.   Neurologic Normal speech. Oriented to person, place, and time. Epicritic sensation to light touch grossly present bilaterally.  Dermatologic  incision with overlying eschar no signs of infection.  Pin site healing well without signs of infection no warmth no erythema no purulence  Orthopedic: No tenderness palpation about the surgical sites.  Ankle position appears anatomic   Radiographs: None Assessment:   1. Essential hypertension    Plan:  Patient was evaluated and treated and all questions answered.  S/p foot surgery left -Progressing as expected post-operatively. -XR: None -WB Status: minimal weightbearing (for transfers only) -Sutures: intact. -Medications: none refilled. -Foot redressed.  Adaptic and 4 x 4 applied around each pin site and incision.  Kerlix and Ace bandage applied  No follow-ups on file.

## 2020-03-16 ENCOUNTER — Other Ambulatory Visit: Payer: Self-pay

## 2020-03-16 ENCOUNTER — Ambulatory Visit (INDEPENDENT_AMBULATORY_CARE_PROVIDER_SITE_OTHER): Payer: Medicare Other | Admitting: Podiatry

## 2020-03-16 ENCOUNTER — Encounter: Payer: Self-pay | Admitting: Podiatry

## 2020-03-16 DIAGNOSIS — Z992 Dependence on renal dialysis: Secondary | ICD-10-CM

## 2020-03-16 DIAGNOSIS — M14672 Charcot's joint, left ankle and foot: Secondary | ICD-10-CM | POA: Diagnosis not present

## 2020-03-16 DIAGNOSIS — E119 Type 2 diabetes mellitus without complications: Secondary | ICD-10-CM

## 2020-03-16 DIAGNOSIS — E11622 Type 2 diabetes mellitus with other skin ulcer: Secondary | ICD-10-CM | POA: Diagnosis not present

## 2020-03-16 DIAGNOSIS — E0822 Diabetes mellitus due to underlying condition with diabetic chronic kidney disease: Secondary | ICD-10-CM

## 2020-03-16 DIAGNOSIS — N186 End stage renal disease: Secondary | ICD-10-CM

## 2020-03-16 DIAGNOSIS — B351 Tinea unguium: Secondary | ICD-10-CM | POA: Diagnosis not present

## 2020-03-16 DIAGNOSIS — Z794 Long term (current) use of insulin: Secondary | ICD-10-CM

## 2020-03-16 DIAGNOSIS — Z89412 Acquired absence of left great toe: Secondary | ICD-10-CM | POA: Diagnosis not present

## 2020-03-16 DIAGNOSIS — L97309 Non-pressure chronic ulcer of unspecified ankle with unspecified severity: Secondary | ICD-10-CM

## 2020-03-16 NOTE — Progress Notes (Signed)
Subjective: Patient presents today for at-risk foot care. He has h/o amputation of left hallux, Charcot neuroarthropathy b/l LE. He c/o discolored, thick toenails which are aggravated when wearing enclosed shoe gear. Pain is getting progressively worse and relieved with periodic professional debridement.   He relates seeing Dr. Kathie Dike for evaluation and states he was told he did not want to surgically intervene and recommended CROW walker. He was prescribed a CROW walker for the left ankle Charcot and has a sandal for the right foot.  Patient states he did hit his right great toe and it is healing.  Patient's wife has brought him to the appointment this morning.  New concern today: Ulcer left ankle. Patient unaware how long it has been present as he is neuropathic. Per chart review, he has seen Dr. Gean Quint at [facility] who prescribed local wound care.  Risk factors: NIDDM, Charcot of left ankle, neuropathy, ESRD on hemodialysis  [facility], [facility] is patient's [facility]. He sees Dr. Ishmael Holter at [facility] and is scheduled for new appointment on 03/21/2020.  Current Outpatient Medications on File Prior to Visit  Medication Sig Dispense Refill  . acetaminophen (TYLENOL) 500 MG tablet Take 1,000 mg by mouth at bedtime as needed for moderate pain.    Marland Kitchen aspirin EC 81 MG tablet Take 81 mg by mouth at bedtime.     . B Complex-C-Folic Acid (DIALYVITE 785) 0.8 MG TABS Take 1 tablet by mouth daily with lunch.     . calcium acetate (PHOSLO) 667 MG capsule Take 2,001 mg by mouth 3 (three) times daily with meals.    . Cinacalcet HCl (SENSIPAR PO) Take by mouth.    . clindamycin (CLEOCIN) 300 MG capsule Take 1 capsule (300 mg total) by mouth 2 (two) times daily. 14 capsule 0  . clopidogrel (PLAVIX) 75 MG tablet Take by mouth.    Marland Kitchen FLUoxetine (PROZAC) 20 MG capsule Take 20 mg by mouth daily.    . furosemide (LASIX) 80 MG tablet Take 80 mg by mouth 2 (two) times daily.    Marland Kitchen glucagon 1 MG  injection Inject 1 mL as directed once as needed for up to 1 dose.    Marland Kitchen glucosamine-chondroitin 500-400 MG tablet Take by mouth.    Marland Kitchen glucose 4 GM chewable tablet Chew 1-5 tablets by mouth as needed for low blood sugar.    Marland Kitchen glucose blood (PRECISION XTRA TEST STRIPS) test strip Use one strip to test blood sugar as directed    . insulin aspart (NOVOLOG FLEXPEN) 100 UNIT/ML FlexPen Inject 7-15 Units into the skin 3 (three) times daily with meals. Sliding Scale Insulin    . insulin glargine (LANTUS) 100 UNIT/ML injection Inject 8 Units into the skin 2 (two) times daily.     Marland Kitchen lidocaine (LMX) 4 % cream Apply topically.    . Lidocaine HCl 4 % CREA Apply topically.    . Methoxy PEG-Epoetin Beta (MIRCERA IJ) Mircera    . nitroGLYCERIN (NITROSTAT) 0.4 MG SL tablet Place under the tongue.    Marland Kitchen oxyCODONE (ROXICODONE) 5 MG immediate release tablet Take 1 tablet (5 mg total) by mouth every 6 (six) hours as needed. 12 tablet 0  . oxyCODONE-acetaminophen (PERCOCET) 5-325 MG tablet Take 1 tablet by mouth every 4 (four) hours as needed for severe pain. 20 tablet 0  . predniSONE (DELTASONE) 50 MG tablet TAKE 1 TABLET BY MOUTH ONCE DAILY    . silver sulfADIAZINE (SILVADENE) 1 % cream Apply pea-sized amount to  wound daily. (Patient taking differently: Apply 1 application topically daily. Apply pea-sized amount to wound daily.) 50 g 0  . simvastatin (ZOCOR) 40 MG tablet Take 40 mg by mouth at bedtime.    . Skin Protectants, Misc. (EUCERIN) cream Apply topically.    . TechLite Lancets MISC 1 each by Misc.(Non-Drug; Combo Route) route daily.    . traMADol (ULTRAM) 50 MG tablet Take by mouth.    . vitamin B-12 (CYANOCOBALAMIN) 500 MCG tablet Take 500 mcg by mouth daily.     No current facility-administered medications on file prior to visit.     Allergies  Allergen Reactions  . Promethazine Other (See Comments)    Tremors, cold, restlessness.  . Povidone-Iodine Rash     Objective: There were no vitals filed  for this visit.  John Ortiz is a pleasant 74 y.o. Caucasian male WD, WN in NAD.Marland Kitchen AAO X 3.  Vascular Examination: Capillary fill time to digits <3 seconds b/l lower extremities. Palpable pedal pulses b/l LE. Pedal hair absent. Lower extremity skin temperature gradient within normal limits. No pain with calf compression b/l. +1 pitting edema b/l lower extremities. No ischemia or gangrene noted b/l lower extremities.  Dermatological Examination: Pedal skin with normal turgor, texture and tone bilaterally. No interdigital macerations bilaterally. Toenails 1-5 right, L 2nd toe, L 3rd toe, L 4th toe and L 5th toe elongated, discolored, dystrophic, thickened, and crumbly with subungual debris and tenderness to dorsal palpation. Healing abrasion distal tip of right hallux. No erythema, no edema, no drainage, no flocculence.  Wound Location: left ankle lateral malleolus There is a minimal amount of devitalized tissue present in the wound. Wound Measurement:  0.3  x 0.3 x 0.1 cm. Wound Base: Mixed Granular/Fibrotic Peri-wound: Normal Exudate: Scant/small amount Serosanguinous exudate Blood Loss during debridement: 0 cc('s). Material in wound which inhibits healing/promotes adjacent tissue breakdown: N/A Description of tissue removed from ulceration today: N/A Sign(s) of clinical bacterial infection: no clinical signs of infection noted on examination today.   Musculoskeletal Examination: Charcot deformity noted left ankle with inversion deformity. .Lower extremity amputation(s): digital amputation L hallux. Patient in wheelchair on today's visit.  DME devices: CROW walker for LLE. Area of drainage from left lateral malleolus noted in CROW walker.  Custom diabetic sandal for RLE.  Neurological Examination:  Protective sensation diminished with 10g monofilament b/l. Vibratory sensation diminished b/l. Clonus negative b/l.  Assessment: 1. Onychomycosis   2. Diabetic ulcer of ankle ([place])    3. Status post amputation of great toe, left (HCC)   4. Charcot ankle, left   5. Diabetes mellitus due to underlying condition with chronic kidney disease on chronic dialysis, with long-term current use of insulin ([place])   6. Encounter for diabetic foot exam [facility])    Plan: -Patient was evaluated and treated and all questions answered. Right hallux contusion healing well. Monitor for now. -Dr. Cannon Kettle examined patient's ulcer of the left ankle and developed a wound care plan.  -Patient educated on diagnosis and treatment plan of routine ulcer debridement/wound care.   Type/amount of devitalized tissue removed: N/A -Today's ulcer size: 0.3 x 0.3 x 0.1 cm. -Ulceration cleansed with wound cleanser. Medihoney Gel applied to base of ulceration and secured with light dressing. -Wound responded well to today's debridement. -Patient risk factors affecting healing of ulcer: diabetes, diabetic neuropathy, history of prior amputation, foot deformity, ESRD on hemodialysis, dyslipidemia -Franne Forts given written instructions on daily wound care for left ankle ulceration to include once daily  application of Medihoney Gel. -Dr. Cannon Kettle evaluated patient and added felt padding to offload area of pressure in CROW walker. -Toenails 1-5 right, L 2nd toe, L 3rd toe, L 4th toe and L 5th toe debrided in length and girth without iatrogenic bleeding with sterile nail nipper and dremel.  -Patient to report any pedal injuries to medical professional immediately. -Patient to continue soft, supportive shoe gear daily. -As requested by Mr. Eimer, written information given to him on Charcot. -Patient/POA to call should there be question/concern in the interim. -He will see me for periodic nail care and follow up with Dr. Cannon Kettle for wound care of the left lateral malleolus.   Return in about 9 weeks (around 05/18/2020) to see me for his nailcare. He will follow up with Dr. Cannon Kettle for left ankle ulceration/Charcot in  2 weeks.  Marzetta Board, DPM

## 2020-03-16 NOTE — Patient Instructions (Signed)
DRESSING CHANGES left ankle:   A. IF DISPENSED, WEAR SURGICAL SHOE/BOOT AT ALL TIMES.  B. IF PRESCRIBED ORAL ANTIBIOTICS, TAKE ALL MEDICATION AS PRESCRIBED UNTIL ALL ARE GONE.  C. IF DOCTOR HAS DESIGNATED NONWEIGHTBEARING STATUS, PLEASE ADHERE TO INSTRUCTIONS.   1. KEEP left lower extremity DRY AT ALL TIMES!!!!  2. CLEANSE ULCER WITH SALINE.  3. DAB DRY WITH GAUZE SPONGE.  4. APPLY A LIGHT AMOUNT OF Medihoney Gel TO BASE OF ULCER.  5. APPLY FABRIC BAND-AID AS INSTRUCTED.  6. WEAR SURGICAL SHOE/BOOT DAILY AT ALL TIMES. IF SUPPLIED, WEAR HEEL PROTECTORS AT ALL TIMES WHEN IN BED.  7. DO NOT WALK BAREFOOT!!!  8.  IF YOU EXPERIENCE ANY FEVER, CHILLS, NIGHTSWEATS, NAUSEA OR VOMITING, ELEVATED OR LOW BLOOD SUGARS, REPORT TO EMERGENCY ROOM.  9. IF YOU EXPERIENCE INCREASED REDNESS, PAIN, SWELLING, DISCOLORATION, ODOR, PUS, DRAINAGE OR WARMTH OF YOUR FOOT, REPORT TO EMERGENCY ROOM.  Neuropathic Arthropathy Neuropathic arthropathy is a condition that results from poor circulation and numbness in the foot and ankle. Poor blood supply and numbness can cause foot or ankle injuries that are too small to be noticed or treated (microtrauma). You may continue to walk on your damaged foot and make the condition worse. Poor circulation can also cause poor healing and bone weakness. Over time, this condition can lead to:  Broken bones.  Bone infection.  Joint damage or dislocation.  Deformities, such as a collapsed foot arch.  Disability of the foot or ankle. Neuropathic arthropathy is also called Charcot arthropathy or Charcot foot. What are the causes? This condition may be caused by any disease that damages nerves and blood vessels of the foot and ankle. Diabetes is the most common cause. In this case, neuropathic arthropathy may also be called diabetic foot.  Less common causes of this condition include:  Polio.  Leprosy.  Syphilis.  Spinal cord damage.  Long-term (chronic)  alcoholism. What are the signs or symptoms? Early symptoms of this condition include:  Foot swelling without a known injury and with very little pain.  Warmth and redness. Later symptoms may include:  Foot deformity.  Ankle instability.  Foot sores (ulcers). How is this diagnosed? This condition may be diagnosed based on:  Your symptoms and medical history.  A physical exam.  Tests, such as: ? Blood tests to check for signs of infection or poorly controlled diabetes. ? X-rays of the ankle and foot to look for bone damage. ? Imaging tests to check for soft tissue or joint damage, such as an MRI or ultrasound. ? A bone scan to check for a bone infection. How is this treated? Treatment depends on the severity of the damage. If the damage is minor, treatment may include:  Wearing a brace, boot, or cast to protect and support the foot and ankle.  Using an assistive device such as a walker, a knee walker, a wheelchair, or crutches to keep weight off the foot so it can heal. If you have a bone or soft tissue infection, you may receive antibiotics. More severe cases may require surgery to repair fractures, remove diseased bone or tissues, or reconstruct the foot or ankle. After treatment, you may need to wear a special cushioned shoe or boot for protection. Follow these instructions at home: If you have a brace, boot, or shoe:  Wear it as told by your health care provider. Remove it only as told by your health care provider.  Loosen it if your toes tingle, become numb, or turn cold  and blue.  Keep it clean and dry. If you have a cast:  Do not put pressure on any part of the cast until it is fully hardened. This may take several hours.  Do not stick anything inside the cast to scratch your skin. Doing that increases your risk of infection.  Check the skin around the cast every day. Tell your health care provider about any concerns.  You may put lotion on dry skin around the  edges of the cast. Do not put lotion on the skin underneath the cast.  Keep it clean and dry. Bathing If you have a cast, brace, boot, or shoe that is not waterproof:  Do not let it get wet.  Cover it with a watertight covering when you take a bath or shower. Activity   Return to your normal activities as told by your health care provider. Ask your health care provider what activities are safe for you.  Avoid sitting for a long time without moving. Get up to take short walks every 1-2 hours. This is important to improve blood flow and breathing. Ask for help if you feel weak or unsteady.  Do not use the injured limb to support your body weight until your health care provider says that you can. Use a walker, a knee walker, a wheelchair, or crutches as told by your health care provider. General instructions      Check your feet every day for any redness, warmth, swelling, or ulceration.  Do not walk barefooted. This could lead to injuries to your foot. Wear well-fitted footwear.  Take over-the-counter and prescription medicines only as told by your health care provider.  If you were prescribed an antibiotic medicine, take it as told by your health care provider. Do not stop using the antibiotic even if you start to feel better.  Do not use any products that contain nicotine or tobacco, such as cigarettes, e-cigarettes, and chewing tobacco. If you need help quitting, ask your health care provider.  Keep all follow-up visits as told by your health care provider. This is important. Contact a health care provider if you have:  A fever or chills.  Any new or worsening symptoms, such as redness, discharge, swelling, warmth, or ulcers on your foot.  Any problems with your cast, brace, or boot. Summary  Neuropathic arthropathy is a condition that results from poor circulation and numbness in the foot and ankle.  Poor blood supply and numbness can cause very small injuries  (microtrauma) to your foot or ankle. Continuing to walk on your damaged foot can make the condition worse.  Any disease that damages nerves and blood vessels of the foot and ankle can cause neuropathic arthropathy. Diabetes is the most common cause.  Foot swelling without a known injury is the most common early symptom.  If the damage is minor, treatment may include wearing a brace, boot, or cast and using a walker, a knee walker, a wheelchair, or crutches. This information is not intended to replace advice given to you by your health care provider. Make sure you discuss any questions you have with your health care provider. Document Revised: 03/03/2019 Document Reviewed: 01/26/2019 Elsevier Patient Education  [place].

## 2020-03-21 ENCOUNTER — Ambulatory Visit: Payer: Medicare Other | Admitting: Podiatry

## 2020-03-28 ENCOUNTER — Encounter: Payer: Self-pay | Admitting: Sports Medicine

## 2020-03-28 ENCOUNTER — Ambulatory Visit (INDEPENDENT_AMBULATORY_CARE_PROVIDER_SITE_OTHER): Payer: Medicare Other | Admitting: Sports Medicine

## 2020-03-28 ENCOUNTER — Other Ambulatory Visit: Payer: Self-pay

## 2020-03-28 DIAGNOSIS — E0822 Diabetes mellitus due to underlying condition with diabetic chronic kidney disease: Secondary | ICD-10-CM

## 2020-03-28 DIAGNOSIS — E11622 Type 2 diabetes mellitus with other skin ulcer: Secondary | ICD-10-CM

## 2020-03-28 DIAGNOSIS — Z992 Dependence on renal dialysis: Secondary | ICD-10-CM

## 2020-03-28 DIAGNOSIS — N186 End stage renal disease: Secondary | ICD-10-CM

## 2020-03-28 DIAGNOSIS — Z89412 Acquired absence of left great toe: Secondary | ICD-10-CM

## 2020-03-28 DIAGNOSIS — L97309 Non-pressure chronic ulcer of unspecified ankle with unspecified severity: Secondary | ICD-10-CM | POA: Diagnosis not present

## 2020-03-28 DIAGNOSIS — M14672 Charcot's joint, left ankle and foot: Secondary | ICD-10-CM | POA: Diagnosis not present

## 2020-03-28 NOTE — Progress Notes (Addendum)
Subjective: John Ortiz is a 74 y.o. male patient seen in office for evaluation of ulceration of the left ankle.  Patient has a history of diabetes and a blood glucose level today not recorded also going to dialysis as well.  Patient reports that he is frustrated and is concerned because he is getting very weak and cannot stand or walk long and has been confined to the wheelchair.  Patient reports that he went to see his PCP and they put him on Eliquis and reports that he is getting some fluid around his heart and lungs and currently at dialysis they are pulling off extra fluid to be helpful.  Denies nausea/fever/vomiting/chills/night sweats/shortness of breath/pain. Patient has no other pedal complaints at this time.  Patient is assisted by wife this visit.  Patient Active Problem List   Diagnosis Date Noted  . Allergic rhinitis 09/15/2019  . Clostridium enterocolitis 09/15/2019  . 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 History of colonic polyps 09/15/2019  . Malignant neoplasm of prostate ([place]) 09/15/2019  . Osteomyelitis ([place]) 09/15/2019  . Stage 5 chronic kidney disease ([place]) 09/15/2019  . Vitreous hemorrhage ([place]) 09/15/2019  . Bell's palsy 08/23/2019  . Headache, unspecified 08/16/2019  . Celiac disease 07/14/2019  . Former smoker 05/25/2019  . Long-term use of aspirin therapy 05/24/2019  . Mixed hyperlipidemia 05/24/2019  . Sprain of right ankle 12/21/2018  . 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 Folliculitis 53/61/4431  . Diabetic ulcer of right foot associated with diabetes mellitus due to underlying condition ([place]) 07/09/2018  . Follow up 07/09/2018  . Anemia in chronic kidney disease, on chronic dialysis ([place]) 06/04/2018  . CHF (congestive heart failure) ([place]) 06/04/2018  . Shortness of breath 06/04/2018  . Diabetes mellitus due to underlying condition with  circulatory complication, with long-term current use of insulin ([place]) 06/04/2018  . Atrial fibrillation and flutter ([place]) 04/22/2018  . Coronary artery disease involving coronary bypass graft of native heart without angina pectoris 04/22/2018  . Aortic stenosis, moderate 04/22/2018  . Hx of CABG 04/22/2018  . S/P AVR (aortic valve replacement) 04/22/2018  . ESRD (end stage renal disease) on dialysis ([place]) 04/22/2018  . PAD (peripheral artery disease) ([place]) 04/14/2018  . Critical lower limb ischemia 04/14/2018  . Hypercalcemia 10/24/2017  . Fluid overload, unspecified 10/22/2017  . Coagulation defect, unspecified ([place]) 08/01/2017  . Diarrhea, unspecified 08/01/2017  . Pain, unspecified 08/01/2017  . Pruritus, unspecified 08/01/2017  . Dependence on renal dialysis ([place]) 07/30/2017  . Iron deficiency anemia, unspecified 07/30/2017  . Long term (current) use of insulin ([place]) 07/30/2017  . Renal osteodystrophy 07/30/2017  . Secondary hyperparathyroidism of renal origin ([place]) 07/30/2017  . Unspecified protein-calorie malnutrition ([place]) 07/30/2017  . Abscess of right groin 04/28/2015  . Coronary artery disease involving native coronary artery of native heart without angina pectoris 12/21/2014  . Chronic diastolic heart failure ([place]) 06/02/2014  . DKA (diabetic ketoacidoses) ([place]) 05/14/2014  . 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 Fracture of tibia 04/07/2014  . Closed left ankle fracture 01/31/2014  . Tibial plateau fracture, left, closed, initial encounter 01/23/2014  . Cellulitis 04/26/2013  . Cerumen impaction 09/18/2012  . Contusion 02/12/2012  . Fall 02/12/2012  . Dyslipidemia 07/29/2011  . Edema 07/29/2011  . Essential hypertension 07/29/2011   Current Outpatient Medications on File Prior to Visit  Medication Sig Dispense Refill  . acetaminophen (TYLENOL) 500 MG tablet Take 1,000 mg by  mouth at bedtime as needed for moderate pain.    Marland Kitchen aspirin EC 81 MG tablet Take 81 mg by mouth at  bedtime.     . B Complex-C-Folic Acid (DIALYVITE 371) 0.8 MG TABS Take 1 tablet by mouth daily with lunch.     . calcium acetate (PHOSLO) 667 MG capsule Take 2,001 mg by mouth 3 (three) times daily with meals.    . Cinacalcet HCl (SENSIPAR PO) Take by mouth.    . clindamycin (CLEOCIN) 300 MG capsule Take 1 capsule (300 mg total) by mouth 2 (two) times daily. 14 capsule 0  . clopidogrel (PLAVIX) 75 MG tablet Take by mouth.    Marland Kitchen FLUoxetine (PROZAC) 20 MG capsule Take 20 mg by mouth daily.    . furosemide (LASIX) 80 MG tablet Take 80 mg by mouth 2 (two) times daily.    Marland Kitchen glucagon 1 MG injection Inject 1 mL as directed once as needed for up to 1 dose.    Marland Kitchen glucosamine-chondroitin 500-400 MG tablet Take by mouth.    Marland Kitchen glucose 4 GM chewable tablet Chew 1-5 tablets by mouth as needed for low blood sugar.    Marland Kitchen glucose blood (PRECISION XTRA TEST STRIPS) test strip Use one strip to test blood sugar as directed    . insulin aspart (NOVOLOG FLEXPEN) 100 UNIT/ML FlexPen Inject 7-15 Units into the skin 3 (three) times daily with meals. Sliding Scale Insulin    . insulin glargine (LANTUS) 100 UNIT/ML injection Inject 8 Units into the skin 2 (two) times daily.     Marland Kitchen lidocaine (LMX) 4 % cream Apply topically.    . Lidocaine HCl 4 % CREA Apply topically.    . Methoxy PEG-Epoetin Beta (MIRCERA IJ) Mircera    . nitroGLYCERIN (NITROSTAT) 0.4 MG SL tablet Place under the tongue.    Marland Kitchen oxyCODONE (ROXICODONE) 5 MG immediate release tablet Take 1 tablet (5 mg total) by mouth every 6 (six) hours as needed. 12 tablet 0  . oxyCODONE-acetaminophen (PERCOCET) 5-325 MG tablet Take 1 tablet by mouth every 4 (four) hours as needed for severe pain. 20 tablet 0  . predniSONE (DELTASONE) 50 MG tablet TAKE 1 TABLET BY MOUTH ONCE DAILY    . silver sulfADIAZINE (SILVADENE) 1 % cream Apply pea-sized amount to wound daily. (Patient taking differently: Apply 1 application topically daily. Apply pea-sized amount to wound daily.) 50 g 0   . simvastatin (ZOCOR) 40 MG tablet Take 40 mg by mouth at bedtime.    . Skin Protectants, Misc. (EUCERIN) cream Apply topically.    . TechLite Lancets MISC 1 each by Misc.(Non-Drug; Combo Route) route daily.    . traMADol (ULTRAM) 50 MG tablet Take by mouth.    . vitamin B-12 (CYANOCOBALAMIN) 500 MCG tablet Take 500 mcg by mouth daily.     No current facility-administered medications on file prior to visit.   Allergies  Allergen Reactions  . Promethazine Other (See Comments)    Tremors, cold, restlessness.  . Povidone-Iodine Rash    No results found for this or any previous visit (from the past 2160 hour(s)).  Objective: There were no vitals filed for this visit.  General: Patient is awake, alert, oriented x 3 and in no acute distress.  Dermatology: Skin is warm and dry bilateral with a partial thickness ulceration present left lateral ankle. Ulceration measures 2 cm x 1 cm x 0.2 cm. There is a Granular border with a fibrogranular base. The ulceration does not  probe to bone.  There is no malodor, no active drainage, faint focal erythema, no edema. No acute signs of infection.   Vascular: Dorsalis Pedis pulse = 1/4 Bilateral,  Posterior Tibial pulse = 0/4 Bilateral,  Capillary Fill Time < 5 seconds in all remaining digits  Neurologic: Protective sensation absent bilateral.  Musculosketal: There is severe Charcot and left foot and ankle deformity likely contributing to issue with ulcer at left ankle.  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  No results for input(s): GRAMSTAIN, LABORGA in the last 8760 hours.  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 -Cleanse ulceration at left lateral ankle -Applied Medihoney and dry sterile dressing and instructed patient to continue with daily dressings at home consisting of same and use of offloading padding -Continue with [place] patient to go to the ER or return to office if the wound worsens or if constitutional symptoms are present. -Patient would like another opinion on what to be done for his foot so I recommended for him to see orthopedic foot and ankle specialist Dr. Andree Elk at [facility] advised patient that we can have his previous records from our office sent once he gets a confirmed appointment with Dr. Andree Elk -Patient to return to office in 2 weeks for follow up wound care or sooner if problems arise.  Landis Martins, DPM

## 2020-04-11 ENCOUNTER — Other Ambulatory Visit: Payer: Self-pay

## 2020-04-11 ENCOUNTER — Ambulatory Visit (INDEPENDENT_AMBULATORY_CARE_PROVIDER_SITE_OTHER): Payer: Medicare Other | Admitting: Sports Medicine

## 2020-04-11 ENCOUNTER — Encounter: Payer: Self-pay | Admitting: Sports Medicine

## 2020-04-11 DIAGNOSIS — E0822 Diabetes mellitus due to underlying condition with diabetic chronic kidney disease: Secondary | ICD-10-CM | POA: Diagnosis not present

## 2020-04-11 DIAGNOSIS — Z992 Dependence on renal dialysis: Secondary | ICD-10-CM

## 2020-04-11 DIAGNOSIS — N186 End stage renal disease: Secondary | ICD-10-CM

## 2020-04-11 DIAGNOSIS — Z89412 Acquired absence of left great toe: Secondary | ICD-10-CM

## 2020-04-11 DIAGNOSIS — L97309 Non-pressure chronic ulcer of unspecified ankle with unspecified severity: Secondary | ICD-10-CM

## 2020-04-11 DIAGNOSIS — M14672 Charcot's joint, left ankle and foot: Secondary | ICD-10-CM

## 2020-04-11 DIAGNOSIS — E11622 Type 2 diabetes mellitus with other skin ulcer: Secondary | ICD-10-CM

## 2020-04-11 DIAGNOSIS — Z794 Long term (current) use of insulin: Secondary | ICD-10-CM

## 2020-04-11 NOTE — Progress Notes (Addendum)
Subjective: John Ortiz is a 74 y.o. male patient seen in office for follow up evaluation of ulceration of the left ankle.  Patient has a history of diabetes and a blood glucose level today 445. Wife reports that ulcer is getting worse and is concerned about the bone coming out, + swelling and warmth, using medihoney, Denies nausea/fever/vomiting/chills/night sweats/shortness of breath/pain. Patient has no other pedal complaints at this time.  Patient is assisted by wife this visit.  Patient Active Problem List   Diagnosis Date Noted  . Allergic rhinitis 09/15/2019  . Clostridium enterocolitis 09/15/2019  . 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 History of colonic polyps 09/15/2019  . Malignant neoplasm of prostate ([place]) 09/15/2019  . Osteomyelitis ([place]) 09/15/2019  . Stage 5 chronic kidney disease ([place]) 09/15/2019  . Vitreous hemorrhage ([place]) 09/15/2019  . Bell's palsy 08/23/2019  . Headache, unspecified 08/16/2019  . Celiac disease 07/14/2019  . Former smoker 05/25/2019  . Long-term use of aspirin therapy 05/24/2019  . Mixed hyperlipidemia 05/24/2019  . Sprain of right ankle 12/21/2018  . 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 Folliculitis 63/89/3734  . Diabetic ulcer of right foot associated with diabetes mellitus due to underlying condition ([place]) 07/09/2018  . Follow up 07/09/2018  . Anemia in chronic kidney disease, on chronic dialysis ([place]) 06/04/2018  . CHF (congestive heart failure) ([place]) 06/04/2018  . Shortness of breath 06/04/2018  . Diabetes mellitus due to underlying condition with circulatory complication, with long-term current use of insulin ([place]) 06/04/2018  . Atrial fibrillation and flutter ([place]) 04/22/2018  . Coronary artery disease involving coronary bypass graft of native heart without angina pectoris 04/22/2018  . Aortic stenosis, moderate 04/22/2018  .  Hx of CABG 04/22/2018  . S/P AVR (aortic valve replacement) 04/22/2018  . ESRD (end stage renal disease) on dialysis ([place]) 04/22/2018  . PAD (peripheral artery disease) ([place]) 04/14/2018  . Critical lower limb ischemia 04/14/2018  . Hypercalcemia 10/24/2017  . Fluid overload, unspecified 10/22/2017  . Coagulation defect, unspecified ([place]) 08/01/2017  . Diarrhea, unspecified 08/01/2017  . Pain, unspecified 08/01/2017  . Pruritus, unspecified 08/01/2017  . Dependence on renal dialysis ([place]) 07/30/2017  . Iron deficiency anemia, unspecified 07/30/2017  . Long term (current) use of insulin ([place]) 07/30/2017  . Renal osteodystrophy 07/30/2017  . Secondary hyperparathyroidism of renal origin ([place]) 07/30/2017  . Unspecified protein-calorie malnutrition ([place]) 07/30/2017  . Abscess of right groin 04/28/2015  . Coronary artery disease involving native coronary artery of native heart without angina pectoris 12/21/2014  . Chronic diastolic heart failure ([place]) 06/02/2014  . DKA (diabetic ketoacidoses) ([place]) 05/14/2014  . 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 Fracture of tibia 04/07/2014  . Closed left ankle fracture 01/31/2014  . Tibial plateau fracture, left, closed, initial encounter 01/23/2014  . Cellulitis 04/26/2013  . Cerumen impaction 09/18/2012  . Contusion 02/12/2012  . Fall 02/12/2012  . Dyslipidemia 07/29/2011  . Edema 07/29/2011  . Essential hypertension 07/29/2011   Current Outpatient Medications on File Prior to Visit  Medication Sig Dispense Refill  . acetaminophen (TYLENOL) 500 MG tablet Take 1,000 mg by mouth at bedtime as needed for moderate pain.    Marland Kitchen aspirin EC 81 MG tablet Take 81 mg by mouth at bedtime.     . B Complex-C-Folic Acid (DIALYVITE 287) 0.8 MG TABS Take 1 tablet by mouth daily with lunch.     . calcium acetate (PHOSLO) 667 MG capsule  Take 2,001 mg by mouth 3 (three) times daily with meals.    . Cinacalcet HCl (SENSIPAR PO) Take by mouth.    . clindamycin (CLEOCIN)  300 MG capsule Take 1 capsule (300 mg total) by mouth 2 (two) times daily. 14 capsule 0  . clopidogrel (PLAVIX) 75 MG tablet Take by mouth.    Marland Kitchen FLUoxetine (PROZAC) 20 MG capsule Take 20 mg by mouth daily.    . furosemide (LASIX) 80 MG tablet Take 80 mg by mouth 2 (two) times daily.    Marland Kitchen glucagon 1 MG injection Inject 1 mL as directed once as needed for up to 1 dose.    Marland Kitchen glucosamine-chondroitin 500-400 MG tablet Take by mouth.    Marland Kitchen glucose 4 GM chewable tablet Chew 1-5 tablets by mouth as needed for low blood sugar.    Marland Kitchen glucose blood (PRECISION XTRA TEST STRIPS) test strip Use one strip to test blood sugar as directed    . insulin aspart (NOVOLOG FLEXPEN) 100 UNIT/ML FlexPen Inject 7-15 Units into the skin 3 (three) times daily with meals. Sliding Scale Insulin    . insulin glargine (LANTUS) 100 UNIT/ML injection Inject 8 Units into the skin 2 (two) times daily.     Marland Kitchen lidocaine (LMX) 4 % cream Apply topically.    . Lidocaine HCl 4 % CREA Apply topically.    . Methoxy PEG-Epoetin Beta (MIRCERA IJ) Mircera    . nitroGLYCERIN (NITROSTAT) 0.4 MG SL tablet Place under the tongue.    Marland Kitchen oxyCODONE (ROXICODONE) 5 MG immediate release tablet Take 1 tablet (5 mg total) by mouth every 6 (six) hours as needed. 12 tablet 0  . oxyCODONE-acetaminophen (PERCOCET) 5-325 MG tablet Take 1 tablet by mouth every 4 (four) hours as needed for severe pain. 20 tablet 0  . predniSONE (DELTASONE) 50 MG tablet TAKE 1 TABLET BY MOUTH ONCE DAILY    . silver sulfADIAZINE (SILVADENE) 1 % cream Apply pea-sized amount to wound daily. (Patient taking differently: Apply 1 application topically daily. Apply pea-sized amount to wound daily.) 50 g 0  . simvastatin (ZOCOR) 40 MG tablet Take 40 mg by mouth at bedtime.    . Skin Protectants, Misc. (EUCERIN) cream Apply topically.    . TechLite Lancets MISC 1 each by Misc.(Non-Drug; Combo Route) route daily.    . traMADol (ULTRAM) 50 MG tablet Take by mouth.    . vitamin B-12  (CYANOCOBALAMIN) 500 MCG tablet Take 500 mcg by mouth daily.     No current facility-administered medications on file prior to visit.   Allergies  Allergen Reactions  . Promethazine Other (See Comments)    Tremors, cold, restlessness.  . Povidone-Iodine Rash    No results found for this or any previous visit (from the past 2160 hour(s)).  Objective: There were no vitals filed for this visit.  General: Patient is awake, alert, oriented x 3 and in no acute distress.  Dermatology: Skin is warm and dry bilateral with a partial thickness ulceration present left lateral ankle. Ulceration measures 2 cm x 1.5 cm x 0.2 cm. There is a Granular border with a fibrogranular base. The ulceration does not  probe to bone with fibula is pronounced. There is no malodor, no active drainage, faint focal erythema, no edema. No acute signs of infection.   Vascular: Dorsalis Pedis pulse = 1/4 Bilateral,  Posterior Tibial pulse = 0/4 Bilateral,  Capillary Fill Time < 5 seconds in all remaining digits  Neurologic: Protective sensation absent bilateral.  Musculosketal:  There is severe Charcot and left foot and ankle deformity likely contributing to issue with ulcer at left ankle, that is fixed.  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  No results for input(s): GRAMSTAIN, LABORGA in the last 8760 hours.  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 -Cleanse ulceration at left lateral ankle -Wound culture obtained for surveillance   -Applied PRISMA  and dry sterile dressing and instructed patient to continue with daily dressings at home consisting of same and use of offloading padding -Rx Biotec to modify crow boot -Meanwhile return to using CAM boot until his boot can be modified  - Advised patient to go to the ER or return to office if the wound worsens or if constitutional symptoms are present. -Patient would like another opinion on what to be done for his foot so I recommended for him to see orthopedic foot and ankle specialist Dr. Andree Elk or Dr. [facility]ney [facility], gave patient referal info for another opinion at today's visit -Patient to return to office in 2-3 weeks for follow up wound care or sooner if problems arise.  Landis Martins, DPM

## 2020-04-18 ENCOUNTER — Telehealth: Payer: Self-pay

## 2020-04-18 ENCOUNTER — Other Ambulatory Visit: Payer: Self-pay | Admitting: Sports Medicine

## 2020-04-18 LAB — WOUND CULTURE

## 2020-04-18 MED ORDER — LEVOFLOXACIN 500 MG PO TABS: 500.0000 mg | ORAL_TABLET | Freq: Every day | ORAL | 0 refills | Status: AC

## 2020-04-18 MED ORDER — CIPROFLOXACIN HCL 500 MG PO TABS: 500.0000 mg | ORAL_TABLET | Freq: Two times a day (BID) | ORAL | 0 refills | Status: DC

## 2020-04-18 NOTE — Telephone Encounter (Signed)
Pt called and stated they were not able to purchase the abx because it was $80. Pt states they would need something cheaper. Please advice

## 2020-04-18 NOTE — Telephone Encounter (Signed)
Changed Rx to Levaquin instead since Cipro is on backorder -Dr. Chauncey Cruel

## 2020-04-18 NOTE — Telephone Encounter (Signed)
Please Advise

## 2020-04-18 NOTE — Telephone Encounter (Signed)
-----   Message from Landis Martins, [place] sent at 04/18/2020  7:00 AM EDT ----- Wound culture positive.  I have sent Cipro antibiotic to his pharmacy ([facility]) Thanks Dr. Cannon Kettle

## 2020-04-18 NOTE — Telephone Encounter (Signed)
Pt was informed of his positive wound cx and Rx abx sent to this pharmacy

## 2020-04-18 NOTE — Addendum Note (Signed)
Addended by: Landis Martins T on: 04/18/2020 07:00 AM   Modules accepted: Orders

## 2020-04-18 NOTE — Telephone Encounter (Signed)
Thank you :)

## 2020-04-18 NOTE — Progress Notes (Signed)
Changed Rx from Cipro to Levaquin since it is on back order -Dr. Chauncey Cruel

## 2020-04-19 NOTE — Telephone Encounter (Signed)
I have already taken care of this and sent Rx to alternative pharmacy -Dr. Chauncey Cruel

## 2020-05-02 ENCOUNTER — Ambulatory Visit (INDEPENDENT_AMBULATORY_CARE_PROVIDER_SITE_OTHER): Payer: Medicare Other | Admitting: Sports Medicine

## 2020-05-02 ENCOUNTER — Encounter: Payer: Self-pay | Admitting: Sports Medicine

## 2020-05-02 ENCOUNTER — Other Ambulatory Visit: Payer: Self-pay

## 2020-05-02 DIAGNOSIS — E11622 Type 2 diabetes mellitus with other skin ulcer: Secondary | ICD-10-CM | POA: Diagnosis not present

## 2020-05-02 DIAGNOSIS — M14672 Charcot's joint, left ankle and foot: Secondary | ICD-10-CM

## 2020-05-02 DIAGNOSIS — Z89412 Acquired absence of left great toe: Secondary | ICD-10-CM | POA: Diagnosis not present

## 2020-05-02 DIAGNOSIS — Z992 Dependence on renal dialysis: Secondary | ICD-10-CM

## 2020-05-02 DIAGNOSIS — E0822 Diabetes mellitus due to underlying condition with diabetic chronic kidney disease: Secondary | ICD-10-CM

## 2020-05-02 DIAGNOSIS — Z794 Long term (current) use of insulin: Secondary | ICD-10-CM

## 2020-05-02 DIAGNOSIS — L97309 Non-pressure chronic ulcer of unspecified ankle with unspecified severity: Secondary | ICD-10-CM

## 2020-05-02 DIAGNOSIS — N186 End stage renal disease: Secondary | ICD-10-CM

## 2020-05-02 NOTE — Patient Instructions (Signed)
Neuropathic Arthropathy Neuropathic arthropathy is a condition that results from poor circulation and numbness in the foot and ankle. Poor blood supply and numbness can cause foot or ankle injuries that are too small to be noticed or treated (microtrauma). You may continue to walk on your damaged foot and make the condition worse. Poor circulation can also cause poor healing and bone weakness. Over time, this condition can lead to:  Broken bones.  Bone infection.  Joint damage or dislocation.  Deformities, such as a collapsed foot arch.  Disability of the foot or ankle. Neuropathic arthropathy is also called Charcot arthropathy or Charcot foot. What are the causes? This condition may be caused by any disease that damages nerves and blood vessels of the foot and ankle. Diabetes is the most common cause. In this case, neuropathic arthropathy may also be called diabetic foot.  Less common causes of this condition include:  Polio.  Leprosy.  Syphilis.  Spinal cord damage.  Long-term (chronic) alcoholism. What are the signs or symptoms? Early symptoms of this condition include:  Foot swelling without a known injury and with very little pain.  Warmth and redness. Later symptoms may include:  Foot deformity.  Ankle instability.  Foot sores (ulcers). How is this diagnosed? This condition may be diagnosed based on:  Your symptoms and medical history.  A physical exam.  Tests, such as: ? Blood tests to check for signs of infection or poorly controlled diabetes. ? X-rays of the ankle and foot to look for bone damage. ? Imaging tests to check for soft tissue or joint damage, such as an MRI or ultrasound. ? A bone scan to check for a bone infection. How is this treated? Treatment depends on the severity of the damage. If the damage is minor, treatment may include:  Wearing a brace, boot, or cast to protect and support the foot and ankle.  Using an assistive device such as a  walker, a knee walker, a wheelchair, or crutches to keep weight off the foot so it can heal. If you have a bone or soft tissue infection, you may receive antibiotics. More severe cases may require surgery to repair fractures, remove diseased bone or tissues, or reconstruct the foot or ankle. After treatment, you may need to wear a special cushioned shoe or boot for protection. Follow these instructions at home: If you have a brace, boot, or shoe:  Wear it as told by your health care provider. Remove it only as told by your health care provider.  Loosen it if your toes tingle, become numb, or turn cold and blue.  Keep it clean and dry. If you have a cast:  Do not put pressure on any part of the cast until it is fully hardened. This may take several hours.  Do not stick anything inside the cast to scratch your skin. Doing that increases your risk of infection.  Check the skin around the cast every day. Tell your health care provider about any concerns.  You may put lotion on dry skin around the edges of the cast. Do not put lotion on the skin underneath the cast.  Keep it clean and dry. Bathing If you have a cast, brace, boot, or shoe that is not waterproof:  Do not let it get wet.  Cover it with a watertight covering when you take a bath or shower. Activity   Return to your normal activities as told by your health care provider. Ask your health care provider what activities   are safe for you.  Avoid sitting for a long time without moving. Get up to take short walks every 1-2 hours. This is important to improve blood flow and breathing. Ask for help if you feel weak or unsteady.  Do not use the injured limb to support your body weight until your health care provider says that you can. Use a walker, a knee walker, a wheelchair, or crutches as told by your health care provider. General instructions      Check your feet every day for any redness, warmth, swelling, or  ulceration.  Do not walk barefooted. This could lead to injuries to your foot. Wear well-fitted footwear.  Take over-the-counter and prescription medicines only as told by your health care provider.  If you were prescribed an antibiotic medicine, take it as told by your health care provider. Do not stop using the antibiotic even if you start to feel better.  Do not use any products that contain nicotine or tobacco, such as cigarettes, e-cigarettes, and chewing tobacco. If you need help quitting, ask your health care provider.  Keep all follow-up visits as told by your health care provider. This is important. Contact a health care provider if you have:  A fever or chills.  Any new or worsening symptoms, such as redness, discharge, swelling, warmth, or ulcers on your foot.  Any problems with your cast, brace, or boot. Summary  Neuropathic arthropathy is a condition that results from poor circulation and numbness in the foot and ankle.  Poor blood supply and numbness can cause very small injuries (microtrauma) to your foot or ankle. Continuing to walk on your damaged foot can make the condition worse.  Any disease that damages nerves and blood vessels of the foot and ankle can cause neuropathic arthropathy. Diabetes is the most common cause.  Foot swelling without a known injury is the most common early symptom.  If the damage is minor, treatment may include wearing a brace, boot, or cast and using a walker, a knee walker, a wheelchair, or crutches. This information is not intended to replace advice given to you by your health care provider. Make sure you discuss any questions you have with your health care provider. Document Revised: 03/03/2019 Document Reviewed: 01/26/2019 Elsevier Patient Education  2020 [employer]  

## 2020-05-02 NOTE — Progress Notes (Signed)
Subjective: John Ortiz is a 74 y.o. male patient seen in office for follow up evaluation of ulceration of the left ankle.  Patient has a history of diabetes and a blood glucose level today 68, patient admits that the wound is looking about the same with the same amount of drainage redness and swelling using Prisma every other day as previously instructed.  Patient also reports pain in multiple joints pain 10 out of 10, denies nausea/fever/vomiting/chills/night sweats/shortness of breath/pain. Patient has no other pedal complaints at this time.  Patient is assisted by wife this visit who was in the waiting room.  Patient Active Problem List   Diagnosis Date Noted  . Allergic rhinitis 09/15/2019  . Clostridium enterocolitis 09/15/2019  . 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 History of colonic polyps 09/15/2019  . Malignant neoplasm of prostate ([place]) 09/15/2019  . Osteomyelitis ([place]) 09/15/2019  . Stage 5 chronic kidney disease ([place]) 09/15/2019  . Vitreous hemorrhage ([place]) 09/15/2019  . Bell's palsy 08/23/2019  . Headache, unspecified 08/16/2019  . Celiac disease 07/14/2019  . Former smoker 05/25/2019  . Long-term use of aspirin therapy 05/24/2019  . Mixed hyperlipidemia 05/24/2019  . Sprain of right ankle 12/21/2018  . 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 Folliculitis 81/44/8185  . Diabetic ulcer of right foot associated with diabetes mellitus due to underlying condition ([place]) 07/09/2018  . Follow up 07/09/2018  . Anemia in chronic kidney disease, on chronic dialysis ([place]) 06/04/2018  . CHF (congestive heart failure) ([place]) 06/04/2018  . Shortness of breath 06/04/2018  . 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 Atrial fibrillation and flutter ([place]) 04/22/2018  . Coronary artery  disease involving coronary bypass graft of native heart without angina pectoris 04/22/2018  . Aortic stenosis, moderate 04/22/2018  . Hx of CABG 04/22/2018  . S/P AVR (aortic valve replacement) 04/22/2018  . ESRD (end stage renal disease) on dialysis ([place]) 04/22/2018  . PAD (peripheral artery disease) ([place]) 04/14/2018  . Critical lower limb ischemia 04/14/2018  . Hypercalcemia 10/24/2017  . Fluid overload, unspecified 10/22/2017  . Coagulation defect, unspecified ([place]) 08/01/2017  . Diarrhea, unspecified 08/01/2017  . Pain, unspecified 08/01/2017  . Pruritus, unspecified 08/01/2017  . Dependence on renal dialysis ([place]) 07/30/2017  . Iron deficiency anemia, unspecified 07/30/2017  . Long term (current) use of insulin ([place]) 07/30/2017  . Renal osteodystrophy 07/30/2017  . Secondary hyperparathyroidism of renal origin ([place]) 07/30/2017  . Unspecified protein-calorie malnutrition ([place]) 07/30/2017  . Abscess of right groin 04/28/2015  . Coronary artery disease involving native coronary artery of native heart without angina pectoris 12/21/2014  . Chronic diastolic heart failure ([place]) 06/02/2014  . DKA (diabetic ketoacidoses) ([place]) 05/14/2014  . 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 Fracture of tibia 04/07/2014  . Closed left ankle fracture 01/31/2014  . Tibial plateau fracture, left, closed, initial encounter 01/23/2014  . Cellulitis 04/26/2013  . Cerumen impaction 09/18/2012  . Contusion 02/12/2012  . Fall 02/12/2012  . Dyslipidemia 07/29/2011  . Edema 07/29/2011  . Essential hypertension 07/29/2011   Current Outpatient Medications on File Prior to Visit  Medication Sig Dispense Refill  . acetaminophen (TYLENOL) 500 MG tablet Take 1,000 mg by mouth at bedtime as needed for moderate pain.    Marland Kitchen aspirin EC 81 MG tablet Take 81 mg by mouth at bedtime.     . B Complex-C-Folic  Acid (DIALYVITE 800) 0.8 MG TABS Take 1 tablet by mouth daily with lunch.     . calcium acetate (PHOSLO) 667 MG capsule  Take 2,001 mg by mouth 3 (three) times daily with meals.    . Cinacalcet HCl (SENSIPAR PO) Take by mouth.    . ciprofloxacin (CIPRO) 500 MG tablet TAKE 1 TABLET BY MOUTH TWICE DAILY 20 tablet 0  . clindamycin (CLEOCIN) 300 MG capsule Take 1 capsule (300 mg total) by mouth 2 (two) times daily. 14 capsule 0  . clopidogrel (PLAVIX) 75 MG tablet Take by mouth.    Marland Kitchen FLUoxetine (PROZAC) 20 MG capsule Take 20 mg by mouth daily.    . furosemide (LASIX) 80 MG tablet Take 80 mg by mouth 2 (two) times daily.    Marland Kitchen glucagon 1 MG injection Inject 1 mL as directed once as needed for up to 1 dose.    Marland Kitchen glucosamine-chondroitin 500-400 MG tablet Take by mouth.    Marland Kitchen glucose 4 GM chewable tablet Chew 1-5 tablets by mouth as needed for low blood sugar.    Marland Kitchen glucose blood (PRECISION XTRA TEST STRIPS) test strip Use one strip to test blood sugar as directed    . insulin aspart (NOVOLOG FLEXPEN) 100 UNIT/ML FlexPen Inject 7-15 Units into the skin 3 (three) times daily with meals. Sliding Scale Insulin    . insulin glargine (LANTUS) 100 UNIT/ML injection Inject 8 Units into the skin 2 (two) times daily.     Marland Kitchen levofloxacin (LEVAQUIN) 500 MG tablet Take 1 tablet (500 mg total) by mouth daily. 7 tablet 0  . lidocaine (LMX) 4 % cream Apply topically.    . Lidocaine HCl 4 % CREA Apply topically.    . Methoxy PEG-Epoetin Beta (MIRCERA IJ) Mircera    . nitroGLYCERIN (NITROSTAT) 0.4 MG SL tablet Place under the tongue.    Marland Kitchen oxyCODONE (ROXICODONE) 5 MG immediate release tablet Take 1 tablet (5 mg total) by mouth every 6 (six) hours as needed. 12 tablet 0  . oxyCODONE-acetaminophen (PERCOCET) 5-325 MG tablet Take 1 tablet by mouth every 4 (four) hours as needed for severe pain. 20 tablet 0  . predniSONE (DELTASONE) 50 MG tablet TAKE 1 TABLET BY MOUTH ONCE DAILY    . silver sulfADIAZINE (SILVADENE) 1 % cream Apply pea-sized amount to wound daily. (Patient taking differently: Apply 1 application topically daily. Apply pea-sized  amount to wound daily.) 50 g 0  . simvastatin (ZOCOR) 40 MG tablet Take 40 mg by mouth at bedtime.    . Skin Protectants, Misc. (EUCERIN) cream Apply topically.    . TechLite Lancets MISC 1 each by Misc.(Non-Drug; Combo Route) route daily.    . traMADol (ULTRAM) 50 MG tablet Take by mouth.    . vitamin B-12 (CYANOCOBALAMIN) 500 MCG tablet Take 500 mcg by mouth daily.     No current facility-administered medications on file prior to visit.   Allergies  Allergen Reactions  . Promethazine Other (See Comments)    Tremors, cold, restlessness.  . Povidone-Iodine Rash    Recent Results (from the past 2160 hour(s))  WOUND CULTURE     Status: Abnormal   Collection Time: 04/11/20 10:00 AM   Specimen: Foot, Left; Wound   Wound Culture and sens  Result Value Ref Range   Gram Stain Result Final report    Organism ID, Bacteria Comment     Comment: No white blood cells seen.   Organism ID, Bacteria Comment     Comment: Many gram  negative rods.   Organism ID, Bacteria Comment     Comment: Rare gram positive rods   Aerobic Bacterial Culture Final report (A)    Organism ID, Bacteria Enterobacter cloacae (A)     Comment: Heavy growth   Organism ID, Bacteria Staphylococcus aureus (A)     Comment: Moderate growth Based on susceptibility to oxacillin this isolate would be susceptible to: *Penicillinase-stable penicillins, such as:   Cloxacillin, Dicloxacillin, Nafcillin *Beta-lactam combination agents, such as:   Amoxicillin-clavulanic acid, Ampicillin-sulbactam,   Piperacillin-tazobactam *Oral cephems, such as:   Cefaclor, Cefdinir, Cefpodoxime, Cefprozil, Cefuroxime,   Cephalexin, Loracarbef *Parenteral cephems, such as:   Cefazolin, Cefepime, Cefotaxime, Cefotetan, Ceftaroline,   Ceftizoxime, Ceftriaxone, Cefuroxime *Carbapenems, such as:   Doripenem, Ertapenem, Imipenem, Meropenem    Organism ID, Bacteria Routine flora     Comment: Heavy growth   Antimicrobial Susceptibility Comment      Comment:       ** S = Susceptible; I = Intermediate; R = Resistant **                    P = Positive; N = Negative             MICS are expressed in micrograms per mL    Antibiotic                 RSLT#1    RSLT#2    RSLT#3    RSLT#4 Amoxicillin/Clavulanic Acid    R Cefazolin                      R Cefepime                       S Cefuroxime                     R Ciprofloxacin                  S         S Clindamycin                              S Erythromycin                             S Gentamicin                     S         S Imipenem                       S Levofloxacin                   S         S Linezolid                                S Meropenem                      S Moxifloxacin                             S Oxacillin  S Penicillin                               R Quinupristin/Dalfopristin                S Rifampin                                 S Tetracycline                   S         S Tobramycin                     S Trim ethoprim/Sulfa             R         S Vancomycin                               S     Objective: There were no vitals filed for this visit.  General: Patient is awake, alert, oriented x 3 and in no acute distress.  Dermatology: Skin is warm and dry bilateral with a partial thickness ulceration present left lateral ankle. Ulceration measures 2 cm x 1.2 cm x 0.2 cm slightly smaller than last visit. There is a Granular border with a fibrogranular base. The ulceration does not probe to bone but fibula is pronounced due to ankle deformity. There is no malodor, no active drainage, faint focal erythema, no edema. No acute signs of infection.   Vascular: Dorsalis Pedis pulse = 1/4 Bilateral,  Posterior Tibial pulse = 0/4 Bilateral,  Capillary Fill Time < 5 seconds in all remaining digits  Neurologic: Protective sensation absent bilateral.  Musculosketal: There is severe Charcot and left foot and ankle deformity  likely contributing to issue with ulcer at left ankle, that is fixed with a little available movement like before.  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  No results for input(s): GRAMSTAIN, LABORGA in the last 8760 hours.  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 -Cleanse ulceration at left lateral ankle -Applied PRISMA  and dry sterile dressing and instructed patient to continue with daily dressings at home consisting of same and to continue to use cam boot -Advised patient to finish taking his antibiotics there is no additional antibiotics needed at this time - Advised patient to go to the ER or return to office if the wound worsens or if constitutional symptoms are present. -Patient encouraged to keep Ortho appointment for other recommendations on what could be done for his severe foot deformity -Patient to call after he goes to Ortho to update me on how his visit went.  Landis Martins, DPM

## 2020-05-16 ENCOUNTER — Ambulatory Visit: Payer: Medicare Other | Admitting: Sports Medicine

## 2020-05-18 ENCOUNTER — Ambulatory Visit: Payer: Medicare Other | Admitting: Podiatry

## 2020-06-01 ENCOUNTER — Telehealth: Payer: Self-pay | Admitting: Cardiology

## 2020-06-01 NOTE — Telephone Encounter (Signed)
Wife called and wanted to advise [place] that John Ortiz's passed away yesterday. I've let [facility] HIM know.

## 2020-06-01 NOTE — Telephone Encounter (Signed)
Sorry to hear that.  Thank you for letting me know.

## 2020-06-26 DEATH — deceased
